# Patient Record
Sex: Female | Born: 1995 | Race: Black or African American | Hispanic: No | Marital: Single | Smoking: Never smoker
Health system: Southern US, Community
[De-identification: ages and names within clinical notes are randomized; demographics above are authoritative.]

## PROBLEM LIST (undated history)

## (undated) DIAGNOSIS — F319 Bipolar disorder, unspecified: Secondary | ICD-10-CM

## (undated) DIAGNOSIS — R51 Headache: Secondary | ICD-10-CM

## (undated) DIAGNOSIS — R519 Headache, unspecified: Secondary | ICD-10-CM

## (undated) HISTORY — DX: Headache, unspecified: R51.9

## (undated) HISTORY — DX: Headache: R51

## (undated) HISTORY — PX: FOOT SURGERY: SHX648

---

## 2016-01-10 ENCOUNTER — Emergency Department (HOSPITAL_COMMUNITY): Payer: Medicaid Other

## 2016-01-10 ENCOUNTER — Emergency Department (HOSPITAL_COMMUNITY)
Admission: EM | Admit: 2016-01-10 | Discharge: 2016-01-10 | Disposition: A | Discharge status: Home / Self Care | Payer: Medicaid Other | Attending: Emergency Medicine | Admitting: Emergency Medicine

## 2016-01-10 ENCOUNTER — Encounter (HOSPITAL_COMMUNITY): Payer: Self-pay

## 2016-01-10 DIAGNOSIS — R0982 Postnasal drip: Secondary | ICD-10-CM | POA: Diagnosis not present

## 2016-01-10 DIAGNOSIS — J3489 Other specified disorders of nose and nasal sinuses: Secondary | ICD-10-CM | POA: Insufficient documentation

## 2016-01-10 DIAGNOSIS — R6883 Chills (without fever): Secondary | ICD-10-CM | POA: Diagnosis not present

## 2016-01-10 DIAGNOSIS — F17200 Nicotine dependence, unspecified, uncomplicated: Secondary | ICD-10-CM | POA: Diagnosis not present

## 2016-01-10 DIAGNOSIS — R05 Cough: Secondary | ICD-10-CM | POA: Insufficient documentation

## 2016-01-10 DIAGNOSIS — R51 Headache: Secondary | ICD-10-CM | POA: Diagnosis not present

## 2016-01-10 DIAGNOSIS — J029 Acute pharyngitis, unspecified: Secondary | ICD-10-CM | POA: Diagnosis not present

## 2016-01-10 DIAGNOSIS — R0981 Nasal congestion: Secondary | ICD-10-CM | POA: Diagnosis not present

## 2016-01-10 DIAGNOSIS — R040 Epistaxis: Secondary | ICD-10-CM | POA: Diagnosis present

## 2016-01-10 MED ORDER — SALINE SPRAY 0.65 % NA SOLN: 1.0000 | NASAL | Status: DC | PRN

## 2016-01-10 MED ORDER — BENZONATATE 100 MG PO CAPS: 200.0000 mg | ORAL_CAPSULE | Freq: Two times a day (BID) | ORAL | Status: DC | PRN

## 2016-01-10 NOTE — Discharge Instructions (Signed)
Nosebleed °Nosebleeds are common. They are due to a crack in the inside lining of your nose (mucous membrane) or from a small blood vessel that starts to bleed. Nosebleeds can be caused by many conditions, such as injury, infections, dry mucous membranes or dry climate, medicines, nose picking, and home heating and cooling systems. Most nosebleeds come from blood vessels in the front of your nose. °HOME CARE INSTRUCTIONS  °· Try controlling your nosebleed by pinching your nostrils gently and continuously for at least 10 minutes. °· Avoid blowing or sniffing your nose for a number of hours after having a nosebleed. °· Do not put gauze inside your nose yourself. If your nose was packed by your health care provider, try to maintain the pack inside of your nose until your health care provider removes it. °· If a gauze pack was used and it starts to fall out, gently replace it or cut off the end of it. °· If a balloon catheter was used to pack your nose, do not cut or remove it unless your health care provider has instructed you to do that. °· Avoid lying down while you are having a nosebleed. Sit up and lean forward. °· Use a nasal spray decongestant to help with a nosebleed as directed by your health care provider. °· Do not use petroleum jelly or mineral oil in your nose. These can drip into your lungs. °· Maintain humidity in your home by using less air conditioning or by using a humidifier. °· Aspirin and blood thinners make bleeding more likely. If you are prescribed these medicines and you suffer from nosebleeds, ask your health care provider if you should stop taking the medicines or adjust the dose. Do not stop medicines unless directed by your health care provider °· Resume your normal activities as you are able, but avoid straining, lifting, or bending at the waist for several days. °· If your nosebleed was caused by dry mucous membranes, use over-the-counter saline nasal spray or gel. This will keep the  mucous membranes moist and allow them to heal. If you must use a lubricant, choose the water-soluble variety. Use it only sparingly, and do not use it within several hours of lying down. °· Keep all follow-up visits as directed by your health care provider. This is important. °SEEK MEDICAL CARE IF: °· You have a fever. °· You get frequent nosebleeds. °· You are getting nosebleeds more often. °SEEK IMMEDIATE MEDICAL CARE IF: °· Your nosebleed lasts longer than 20 minutes. °· Your nosebleed occurs after an injury to your face, and your nose looks crooked or broken. °· You have unusual bleeding from other parts of your body. °· You have unusual bruising on other parts of your body. °· You feel light-headed or you faint. °· You become sweaty. °· You vomit blood. °· Your nosebleed occurs after a head injury. °  °This information is not intended to replace advice given to you by your health care provider. Make sure you discuss any questions you have with your health care provider. °  °Document Released: 09/07/2005 Document Revised: 12/19/2014 Document Reviewed: 07/14/2014 °Elsevier Interactive Patient Education ©2016 [employer] ° °Acute Bronchitis °Bronchitis is inflammation of the airways that extend from the windpipe into the lungs (bronchi). The inflammation often causes mucus to develop. This leads to a cough, which is the most common symptom of bronchitis.  °In acute bronchitis, the condition usually develops suddenly and goes away over time, usually in a couple weeks. Smoking, allergies, and asthma   can make bronchitis worse. Repeated episodes of bronchitis may cause further lung problems.  °CAUSES °Acute bronchitis is most often caused by the same virus that causes a cold. The virus can spread from person to person (contagious) through coughing, sneezing, and touching contaminated objects. °SIGNS AND SYMPTOMS  °· Cough.   °· Fever.   °· Coughing up mucus.   °· Body aches.   °· Chest congestion.   °· Chills.    °· Shortness of breath.   °· Sore throat.   °DIAGNOSIS  °Acute bronchitis is usually diagnosed through a physical exam. Your health care provider will also ask you questions about your medical history. Tests, such as chest X-rays, are sometimes done to rule out other conditions.  °TREATMENT  °Acute bronchitis usually goes away in a couple weeks. Oftentimes, no medical treatment is necessary. Medicines are sometimes given for relief of fever or cough. Antibiotic medicines are usually not needed but may be prescribed in certain situations. In some cases, an inhaler may be recommended to help reduce shortness of breath and control the cough. A cool mist vaporizer may also be used to help thin bronchial secretions and make it easier to clear the chest.  °HOME CARE INSTRUCTIONS °· Get plenty of rest.   °· Drink enough fluids to keep your urine clear or pale yellow (unless you have a medical condition that requires fluid restriction). Increasing fluids may help thin your respiratory secretions (sputum) and reduce chest congestion, and it will prevent dehydration.   °· Take medicines only as directed by your health care provider. °· If you were prescribed an antibiotic medicine, finish it all even if you start to feel better. °· Avoid smoking and secondhand smoke. Exposure to cigarette smoke or irritating chemicals will make bronchitis worse. If you are a smoker, consider using nicotine gum or skin patches to help control withdrawal symptoms. Quitting smoking will help your lungs heal faster.   °· Reduce the chances of another bout of acute bronchitis by washing your hands frequently, avoiding people with cold symptoms, and trying not to touch your hands to your mouth, nose, or eyes.   °· Keep all follow-up visits as directed by your health care provider.   °SEEK MEDICAL CARE IF: °Your symptoms do not improve after 1 week of treatment.  °SEEK IMMEDIATE MEDICAL CARE IF: °· You develop an increased fever or chills.    °· You have chest pain.   °· You have severe shortness of breath. °· You have bloody sputum.   °· You develop dehydration. °· You faint or repeatedly feel like you are going to pass out. °· You develop repeated vomiting. °· You develop a severe headache. °MAKE SURE YOU:  °· Understand these instructions. °· Will watch your condition. °· Will get help right away if you are not doing well or get worse. °  °This information is not intended to replace advice given to you by your health care provider. Make sure you discuss any questions you have with your health care provider. °  °Document Released: 01/05/2005 Document Revised: 12/19/2014 Document Reviewed: 05/21/2013 °Elsevier Interactive Patient Education ©2016 [employer] ° °

## 2016-01-10 NOTE — ED Provider Notes (Signed)
CSN: [id number]     Arrival date & time 01/10/16  1514 History   First MD Initiated Contact with Patient 01/10/16 1550     Chief Complaint  Patient presents with  . Epistaxis  . Generalized Body Aches  . Emesis     (Consider location/radiation/quality/duration/timing/severity/associated sxs/prior Treatment) HPI Comments: Patient presents to the ED with a chief complaint of nasal congestion with some blood when she blows her nose.  She states that the symptoms have been ongoing for the past two days.  She has some associated generalized body aches and headache.  She also complains of ear congestion and some chest pain when coughing.  She denies productive cough.  Denies fevers or chills.  Denies history of ACS, PE, DVT, recent long travel or surgery.  The history is provided by the patient. No language interpreter was used.    History reviewed. No pertinent past medical history. History reviewed. No pertinent past surgical history. History reviewed. No pertinent family history. Social History  Substance Use Topics  . Smoking status: Current Some Day Smoker  . Smokeless tobacco: None  . Alcohol Use: Yes   OB History    No data available     Review of Systems  Constitutional: Positive for chills. Negative for fever.  HENT: Positive for postnasal drip, rhinorrhea, sinus pressure, sneezing and sore throat.   Respiratory: Positive for cough. Negative for shortness of breath.   Cardiovascular: Negative for chest pain.  Gastrointestinal: Negative for nausea, vomiting, abdominal pain, diarrhea and constipation.  Genitourinary: Negative for dysuria.  All other systems reviewed and are negative.     Allergies  Review of patient's allergies indicates not on file.  Home Medications   Prior to Admission medications   Not on File   BP 136/82 mmHg  Pulse 88  Temp(Src) 98.6 F (37 C) (Oral)  Resp 20  SpO2 100% Physical Exam Physical Exam  Constitutional: Pt  is oriented to  person, place, and time. Appears well-developed and well-nourished. No distress.  HENT:  Head: Normocephalic and atraumatic.  Right Ear: Tympanic membrane, external ear and ear canal normal.  Left Ear: Tympanic membrane, external ear and ear canal normal.  Nose: Mucosal edema and moderate rhinorrhea present. No epistaxis. Right sinus exhibits no maxillary sinus tenderness and no frontal sinus tenderness. Left sinus exhibits no maxillary sinus tenderness and no frontal sinus tenderness.  Mouth/Throat: Uvula is midline and mucous membranes are normal. Mucous membranes are not pale and not cyanotic. No oropharyngeal exudate, posterior oropharyngeal edema, posterior oropharyngeal erythema or tonsillar abscesses.  Eyes: Conjunctivae are normal. Pupils are equal, round, and reactive to light.  Neck: Normal range of motion and full passive range of motion without pain.  Cardiovascular: Normal rate and intact distal pulses.   Pulmonary/Chest: Effort normal and breath sounds normal. No stridor.  Clear and equal breath sounds without focal wheezes, rhonchi, rales  Abdominal: Soft. Bowel sounds are normal. There is no tenderness.  Musculoskeletal: Normal range of motion.  Lymphadenopathy:    Pthas no cervical adenopathy.  Neurological: Pt is alert and oriented to person, place, and time.  Skin: Skin is warm and dry. No rash noted. Pt is not diaphoretic.  Psychiatric: Normal mood and affect.  Nursing note and vitals reviewed.   ED Course  Procedures (including critical care time)  Imaging Review Dg Chest 2 View  01/10/2016  CLINICAL DATA:  Central chest pain and shortness of breath. Coughing. EXAM: CHEST  2 VIEW COMPARISON:  None. FINDINGS: Heart  size is normal. Mediastinal shadows are normal. There is central bronchial thickening but no infiltrate, collapse or effusion. No bony abnormality. IMPRESSION: Possible bronchitis.  No consolidation or collapse. Electronically Signed   By: Paulina Fusi M.D.    On: 01/10/2016 16:15   I have personally reviewed and evaluated these images and lab results as part of my medical decision-making.    MDM   Final diagnoses:  Sinus congestion  Epistaxis    Patient with sinus congestion, some blood after blowing nose.  Will recommend nasal saline.  Afebrile, VSS.  No history of ACS, DVT, or PE.  Suspect that chest pain is from coughing.  CXR is remarkable for possible bronchitis.  Patient is very well appearing.  Recommend PCP follow-up.   Roxy Horseman, PA-C 01/10/16 1625  Marily Memos, MD 01/10/16 [phone]

## 2016-01-10 NOTE — ED Notes (Signed)
Patient was alert, oriented and stable upon discharge. RN went over AVS and patient had no further questions.  

## 2016-01-10 NOTE — ED Notes (Addendum)
Pt c/o epistaxis, generalized body aches, headache, and n/v starting this morning and constipation x 2 days.  Pain score 6/10.  Pt reports "my nose is bleeding, but it's staying up, inside.  I see it when I blow my nose.  It's blood mixed w/ mucus."  No bleeding noted.  Pt reports chronic GI issues.

## 2016-01-18 ENCOUNTER — Emergency Department (INDEPENDENT_AMBULATORY_CARE_PROVIDER_SITE_OTHER)
Admission: EM | Admit: 2016-01-18 | Discharge: 2016-01-18 | Disposition: A | Discharge status: Home / Self Care | Payer: Medicaid Other | Source: Home / Self Care | Attending: Family Medicine | Admitting: Family Medicine

## 2016-01-18 ENCOUNTER — Other Ambulatory Visit (HOSPITAL_COMMUNITY)
Admission: RE | Admit: 2016-01-18 | Discharge: 2016-01-18 | Disposition: A | Discharge status: Home / Self Care | Payer: Medicaid Other | Source: Ambulatory Visit | Attending: Family Medicine | Admitting: Family Medicine

## 2016-01-18 ENCOUNTER — Encounter (HOSPITAL_COMMUNITY): Payer: Self-pay | Admitting: Emergency Medicine

## 2016-01-18 DIAGNOSIS — J40 Bronchitis, not specified as acute or chronic: Secondary | ICD-10-CM | POA: Diagnosis present

## 2016-01-18 LAB — POCT RAPID STREP A: Streptococcus, Group A Screen (Direct): NEGATIVE

## 2016-01-18 MED ORDER — ALBUTEROL SULFATE HFA 108 (90 BASE) MCG/ACT IN AERS: 2.0000 | INHALATION_SPRAY | RESPIRATORY_TRACT | Status: DC | PRN

## 2016-01-18 MED ORDER — AZITHROMYCIN 250 MG PO TABS: ORAL_TABLET | ORAL | Status: DC

## 2016-01-18 NOTE — ED Notes (Signed)
The patient presented to the [facility] with a complaint of a sore throat and dizziness  x 1 week. The patient stated that she was evaluated at [facility] ED on 01/10/2016 and was diagnosed with an ear infection and bronchitis. The patient was prescribed amoxacillin and is currently still taking it.

## 2016-01-18 NOTE — Discharge Instructions (Signed)
Upper Respiratory Infection, Adult °Most upper respiratory infections (URIs) are a viral infection of the air passages leading to the lungs. A URI affects the nose, throat, and upper air passages. The most common type of URI is nasopharyngitis and is typically referred to as "the common cold." °URIs run their course and usually go away on their own. Most of the time, a URI does not require medical attention, but sometimes a bacterial infection in the upper airways can follow a viral infection. This is called a secondary infection. Sinus and middle ear infections are common types of secondary upper respiratory infections. °Bacterial pneumonia can also complicate a URI. A URI can worsen asthma and chronic obstructive pulmonary disease (COPD). Sometimes, these complications can require emergency medical care and may be life threatening.  °CAUSES °Almost all URIs are caused by viruses. A virus is a type of germ and can spread from one person to another.  °RISKS FACTORS °You may be at risk for a URI if:  °· You smoke.   °· You have chronic heart or lung disease. °· You have a weakened defense (immune) system.   °· You are very young or very old.   °· You have nasal allergies or asthma. °· You work in crowded or poorly ventilated areas. °· You work in health care facilities or schools. °SIGNS AND SYMPTOMS  °Symptoms typically develop 2-3 days after you come in contact with a cold virus. Most viral URIs last 7-10 days. However, viral URIs from the influenza virus (flu virus) can last 14-18 days and are typically more severe. Symptoms may include:  °· Runny or stuffy (congested) nose.   °· Sneezing.   °· Cough.   °· Sore throat.   °· Headache.   °· Fatigue.   °· Fever.   °· Loss of appetite.   °· Pain in your forehead, behind your eyes, and over your cheekbones (sinus pain). °· Muscle aches.   °DIAGNOSIS  °Your health care provider may diagnose a URI by: °· Physical exam. °· Tests to check that your symptoms are not due to  another condition such as: °· Strep throat. °· Sinusitis. °· Pneumonia. °· Asthma. °TREATMENT  °A URI goes away on its own with time. It cannot be cured with medicines, but medicines may be prescribed or recommended to relieve symptoms. Medicines may help: °· Reduce your fever. °· Reduce your cough. °· Relieve nasal congestion. °HOME CARE INSTRUCTIONS  °· Take medicines only as directed by your health care provider.   °· Gargle warm saltwater or take cough drops to comfort your throat as directed by your health care provider. °· Use a warm mist humidifier or inhale steam from a shower to increase air moisture. This may make it easier to breathe. °· Drink enough fluid to keep your urine clear or pale yellow.   °· Eat soups and other clear broths and maintain good nutrition.   °· Rest as needed.   °· Return to work when your temperature has returned to normal or as your health care provider advises. You may need to stay home longer to avoid infecting others. You can also use a face mask and careful hand washing to prevent spread of the virus. °· Increase the usage of your inhaler if you have asthma.   °· Do not use any tobacco products, including cigarettes, chewing tobacco, or electronic cigarettes. If you need help quitting, ask your health care provider. °PREVENTION  °The best way to protect yourself from getting a cold is to practice good hygiene.  °· Avoid oral or hand contact with people with cold   symptoms.   °· Wash your hands often if contact occurs.   °There is no clear evidence that vitamin C, vitamin E, echinacea, or exercise reduces the chance of developing a cold. However, it is always recommended to get plenty of rest, exercise, and practice good nutrition.  °SEEK MEDICAL CARE IF:  °· You are getting worse rather than better.   °· Your symptoms are not controlled by medicine.   °· You have chills. °· You have worsening shortness of breath. °· You have brown or red mucus. °· You have yellow or brown nasal  discharge. °· You have pain in your face, especially when you bend forward. °· You have a fever. °· You have swollen neck glands. °· You have pain while swallowing. °· You have white areas in the back of your throat. °SEEK IMMEDIATE MEDICAL CARE IF:  °· You have severe or persistent: °¨ Headache. °¨ Ear pain. °¨ Sinus pain. °¨ Chest pain. °· You have chronic lung disease and any of the following: °¨ Wheezing. °¨ Prolonged cough. °¨ Coughing up blood. °¨ A change in your usual mucus. °· You have a stiff neck. °· You have changes in your: °¨ Vision. °¨ Hearing. °¨ Thinking. °¨ Mood. °MAKE SURE YOU:  °· Understand these instructions. °· Will watch your condition. °· Will get help right away if you are not doing well or get worse. °  °This information is not intended to replace advice given to you by your health care provider. Make sure you discuss any questions you have with your health care provider. °  °Document Released: 05/24/2001 Document Revised: 04/14/2015 Document Reviewed: 03/05/2014 °Elsevier Interactive Patient Education ©2016 [employer] °Acute Bronchitis °Bronchitis is inflammation of the airways that extend from the windpipe into the lungs (bronchi). The inflammation often causes mucus to develop. This leads to a cough, which is the most common symptom of bronchitis.  °In acute bronchitis, the condition usually develops suddenly and goes away over time, usually in a couple weeks. Smoking, allergies, and asthma can make bronchitis worse. Repeated episodes of bronchitis may cause further lung problems.  °CAUSES °Acute bronchitis is most often caused by the same virus that causes a cold. The virus can spread from person to person (contagious) through coughing, sneezing, and touching contaminated objects. °SIGNS AND SYMPTOMS  °· Cough.   °· Fever.   °· Coughing up mucus.   °· Body aches.   °· Chest congestion.   °· Chills.   °· Shortness of breath.   °· Sore throat.   °DIAGNOSIS  °Acute bronchitis is  usually diagnosed through a physical exam. Your health care provider will also ask you questions about your medical history. Tests, such as chest X-rays, are sometimes done to rule out other conditions.  °TREATMENT  °Acute bronchitis usually goes away in a couple weeks. Oftentimes, no medical treatment is necessary. Medicines are sometimes given for relief of fever or cough. Antibiotic medicines are usually not needed but may be prescribed in certain situations. In some cases, an inhaler may be recommended to help reduce shortness of breath and control the cough. A cool mist vaporizer may also be used to help thin bronchial secretions and make it easier to clear the chest.  °HOME CARE INSTRUCTIONS °· Get plenty of rest.   °· Drink enough fluids to keep your urine clear or pale yellow (unless you have a medical condition that requires fluid restriction). Increasing fluids may help thin your respiratory secretions (sputum) and reduce chest congestion, and it will prevent dehydration.   °· Take medicines only as directed by   your health care provider. °· If you were prescribed an antibiotic medicine, finish it all even if you start to feel better. °· Avoid smoking and secondhand smoke. Exposure to cigarette smoke or irritating chemicals will make bronchitis worse. If you are a smoker, consider using nicotine gum or skin patches to help control withdrawal symptoms. Quitting smoking will help your lungs heal faster.   °· Reduce the chances of another bout of acute bronchitis by washing your hands frequently, avoiding people with cold symptoms, and trying not to touch your hands to your mouth, nose, or eyes.   °· Keep all follow-up visits as directed by your health care provider.   °SEEK MEDICAL CARE IF: °Your symptoms do not improve after 1 week of treatment.  °SEEK IMMEDIATE MEDICAL CARE IF: °· You develop an increased fever or chills.   °· You have chest pain.   °· You have severe shortness of breath. °· You have bloody  sputum.   °· You develop dehydration. °· You faint or repeatedly feel like you are going to pass out. °· You develop repeated vomiting. °· You develop a severe headache. °MAKE SURE YOU:  °· Understand these instructions. °· Will watch your condition. °· Will get help right away if you are not doing well or get worse. °  °This information is not intended to replace advice given to you by your health care provider. Make sure you discuss any questions you have with your health care provider. °  °Document Released: 01/05/2005 Document Revised: 12/19/2014 Document Reviewed: 05/21/2013 °Elsevier Interactive Patient Education ©2016 [employer] ° °

## 2016-01-18 NOTE — ED Provider Notes (Signed)
CSN: [id number]     Arrival date & time 01/18/16  1604 History   First MD Initiated Contact with Patient 01/18/16 1743     Chief Complaint  Patient presents with  . Sore Throat  . Dizziness   (Consider location/radiation/quality/duration/timing/severity/associated sxs/prior Treatment) HPI History obtained from patient:   LOCATION: Upper respiratory SEVERITY: 3 DURATION: Over 2 weeks  CONTEXT: Sudden onset Better for a while and then came back and is worse at this time. QUALITY: MODIFYING FACTORS: Over-the-counter medicines without relief of symptoms ASSOCIATED SYMPTOMS: Cough dry hacking unable to sleep TIMING: Constant OCCUPATION:  History reviewed. No pertinent past medical history. History reviewed. No pertinent past surgical history. History reviewed. No pertinent family history. Social History  Substance Use Topics  . Smoking status: Current Some Day Smoker  . Smokeless tobacco: None  . Alcohol Use: Yes   OB History    No data available     Review of Systems ROS +'ve cough Denies: HEADACHE, NAUSEA, ABDOMINAL PAIN, CHEST PAIN, CONGESTION, DYSURIA, SHORTNESS OF BREATH  Allergies  Review of patient's allergies indicates no known allergies.  Home Medications   Prior to Admission medications   Medication Sig Start Date End Date Taking? Authorizing Provider  benzonatate (TESSALON) 100 MG capsule Take 2 capsules (200 mg total) by mouth 2 (two) times daily as needed for cough. 01/10/16  Yes Roxy Horseman, PA-C  levocetirizine (XYZAL) 5 MG tablet Take 5 mg by mouth 2 (two) times daily.   Yes Historical Provider, MD  ranitidine (ZANTAC) 300 MG tablet Take 300 mg by mouth 2 (two) times daily.   Yes Historical Provider, MD  sodium chloride (OCEAN) 0.65 % SOLN nasal spray Place 1 spray into both nostrils as needed for congestion. 01/10/16  Yes Roxy Horseman, PA-C  albuterol (PROVENTIL HFA;VENTOLIN HFA) 108 (90 Base) MCG/ACT inhaler Inhale 2 puffs into the lungs every 4  (four) hours as needed for wheezing or shortness of breath. 01/18/16   Tharon Aquas, PA  azithromycin (ZITHROMAX) 250 MG tablet Take first 2 tablets together, then 1 every day until finished. 01/18/16   Tharon Aquas, PA   Meds Ordered and Administered this Visit  Medications - No data to display  BP 137/80 mmHg  Pulse 79  Temp(Src) 98.9 F (37.2 C) (Oral)  Resp 16  SpO2 100% No data found.   Physical Exam  Constitutional: She is oriented to person, place, and time. She appears well-developed and well-nourished.  HENT:  Head: Normocephalic and atraumatic.  Right Ear: External ear normal.  Left Ear: External ear normal.  Eyes: Conjunctivae are normal.  Neck: Normal range of motion. Neck supple.  Pulmonary/Chest: Effort normal and breath sounds normal.  Coarse breath sounds  Musculoskeletal: Normal range of motion.  Neurological: She is alert and oriented to person, place, and time.  Skin: Skin is warm and dry.  Psychiatric: She has a normal mood and affect. Her behavior is normal.  Vitals reviewed.   ED Course  Procedures (including critical care time)  Labs Review Labs Reviewed  POCT RAPID STREP A    Imaging Review No results found.   Visual Acuity Review  Right Eye Distance:   Left Eye Distance:   Bilateral Distance:    Right Eye Near:   Left Eye Near:    Bilateral Near:         MDM   1. Bronchitis     Patient is advised to continue home symptomatic treatment. Prescription for zpak and albuterol sent pharmacy  patient has indicated. Patient is advised that if there are new or worsening symptoms or attend the emergency department, or contact primary care provider. Instructions of care provided discharged home in stable condition. Return to work/school note provided.  THIS NOTE WAS GENERATED USING A VOICE RECOGNITION SOFTWARE PROGRAM. ALL REASONABLE EFFORTS  WERE MADE TO PROOFREAD THIS DOCUMENT FOR ACCURACY.     Tharon Aquas, PA 01/18/16  2052

## 2016-01-21 LAB — CULTURE, GROUP A STREP (THRC)

## 2016-02-04 ENCOUNTER — Encounter (HOSPITAL_COMMUNITY): Payer: Self-pay | Admitting: Family Medicine

## 2016-02-04 ENCOUNTER — Emergency Department (INDEPENDENT_AMBULATORY_CARE_PROVIDER_SITE_OTHER)
Admission: EM | Admit: 2016-02-04 | Discharge: 2016-02-04 | Disposition: A | Discharge status: Home / Self Care | Payer: Medicaid Other | Source: Home / Self Care | Attending: Family Medicine | Admitting: Family Medicine

## 2016-02-04 DIAGNOSIS — J019 Acute sinusitis, unspecified: Secondary | ICD-10-CM | POA: Diagnosis not present

## 2016-02-04 DIAGNOSIS — Z9119 Patient's noncompliance with other medical treatment and regimen: Secondary | ICD-10-CM | POA: Diagnosis not present

## 2016-02-04 DIAGNOSIS — Z91199 Patient's noncompliance with other medical treatment and regimen due to unspecified reason: Secondary | ICD-10-CM

## 2016-02-04 MED ORDER — FLUCONAZOLE 150 MG PO TABS: 150.0000 mg | ORAL_TABLET | Freq: Every day | ORAL | Status: DC

## 2016-02-04 MED ORDER — AMOXICILLIN-POT CLAVULANATE 875-125 MG PO TABS: 1.0000 | ORAL_TABLET | Freq: Two times a day (BID) | ORAL | Status: DC

## 2016-02-04 NOTE — ED Notes (Signed)
Cough for one week.

## 2016-02-04 NOTE — Discharge Instructions (Signed)
He developed maxillary sinusitis. This is a deep infection of the face gets significantly worse if not properly treated. Please take antibiotics as prescribed. If you develop a vaginal yeast infection from the medicine please start the Diflucan. Please use ibuprofen 600 mg every 6 hours for pain and fever. Her cough will likely last an additional 6 weeks due to the prolonged irritation of the lungs. Please take all of her medications exactly as prescribed. Please go to the emergency room if you get worse.

## 2016-02-04 NOTE — ED Provider Notes (Addendum)
CSN: [id number]     Arrival date & time 02/04/16  1608 History   First MD Initiated Contact with Patient 02/04/16 1705     No chief complaint on file.  (Consider location/radiation/quality/duration/timing/severity/associated sxs/prior Treatment) HPI   Patient seen in urgent care clinic on 01/18/2016 and started on amoxicillin for ear infection. Patient states that she only took the medicine once a day and intermittently stops the medicine during her treatment. She stated that she did this because she felt intermittently better. States that she has significantly worse partially 4 days ago involving worsening facial pain, cough, sore throat. Multiple sick contacts. Associated with headache, subjective fevers, runny nose, cough. Denies any nausea vomiting diarrhea constipation abdominal pain dysuria frequency back pain rash.  History reviewed. No pertinent past medical history. History reviewed. No pertinent past surgical history. Family History  Problem Relation Age of Onset  . Hypertension Other   . Diabetes Other   . Stroke Other   . Heart failure Other    Social History  Substance Use Topics  . Smoking status: Current Some Day Smoker  . Smokeless tobacco: None  . Alcohol Use: Yes   OB History    No data available     Review of Systems Per HPI with all other pertinent systems negative.   Allergies  Review of patient's allergies indicates no known allergies.  Home Medications   Prior to Admission medications   Medication Sig Start Date End Date Taking? Authorizing Provider  albuterol (PROVENTIL HFA;VENTOLIN HFA) 108 (90 Base) MCG/ACT inhaler Inhale 2 puffs into the lungs every 4 (four) hours as needed for wheezing or shortness of breath. 01/18/16   Tharon Aquas, PA  amoxicillin-clavulanate (AUGMENTIN) 875-125 MG tablet Take 1 tablet by mouth 2 (two) times daily. 02/04/16   Ozella Rocks, MD  fluconazole (DIFLUCAN) 150 MG tablet Take 1 tablet (150 mg total) by mouth daily.  Repeat dose in 3 days 02/04/16   Ozella Rocks, MD  levocetirizine (XYZAL) 5 MG tablet Take 5 mg by mouth 2 (two) times daily.    Historical Provider, MD  ranitidine (ZANTAC) 300 MG tablet Take 300 mg by mouth 2 (two) times daily.    Historical Provider, MD   Meds Ordered and Administered this Visit  Medications - No data to display  BP 129/83 mmHg  Pulse 66  Temp(Src) 98.8 F (37.1 C) (Oral)  Resp 16  SpO2 100% No data found.   Physical Exam Physical Exam  Constitutional: oriented to person, place, and time. appears well-developed and well-nourished. No distress.  HENT: TMs normal bilaterally Right maxillary sinus very tender to palpation. Pharyngeal cobblestoning. Head: Normocephalic and atraumatic.  Eyes: EOMI. PERRL.  Neck: Normal range of motion.  Cardiovascular: RRR, no m/r/g, 2+ distal pulses,  Pulmonary/Chest: Effort normal and breath sounds normal. No respiratory distress.  Abdominal: Soft. Bowel sounds are normal. NonTTP, no distension.  Musculoskeletal: Normal range of motion. Non ttp, no effusion.  Neurological: alert and oriented to person, place, and time.  Skin: Skin is warm. No rash noted. non diaphoretic.  Psychiatric: normal mood and affect. behavior is normal. Judgment and thought content normal.   ED Course  Procedures (including critical care time)  Labs Review Labs Reviewed - No data to display  Imaging Review No results found.   Visual Acuity Review  Right Eye Distance:   Left Eye Distance:   Bilateral Distance:    Right Eye Near:   Left Eye Near:    Bilateral [id number]:  MDM   1. Subacute sinusitis   2. History of noncompliance with medical treatment    .Augmentin. Diflucan if develops yeast infection. Lengthy discussion had with patient regarding proper use of antibiotics, bacterial resistance, and importance of medical compliance with recommended regimens  I have verbally reviewed the discharge instructions with the patient.  A printed AVS was given to the patient.  All questions were answered prior to discharge.    Ozella Rocks, MD 02/04/16 1731  Ozella Rocks, MD 02/04/16 [phone]

## 2016-02-06 ENCOUNTER — Encounter (HOSPITAL_COMMUNITY): Payer: Self-pay | Admitting: Emergency Medicine

## 2016-02-06 ENCOUNTER — Emergency Department (HOSPITAL_COMMUNITY)
Admission: EM | Admit: 2016-02-06 | Discharge: 2016-02-06 | Disposition: A | Discharge status: Home / Self Care | Payer: Medicaid Other | Attending: Emergency Medicine | Admitting: Emergency Medicine

## 2016-02-06 DIAGNOSIS — R42 Dizziness and giddiness: Secondary | ICD-10-CM | POA: Diagnosis not present

## 2016-02-06 DIAGNOSIS — Z792 Long term (current) use of antibiotics: Secondary | ICD-10-CM | POA: Diagnosis not present

## 2016-02-06 DIAGNOSIS — J069 Acute upper respiratory infection, unspecified: Secondary | ICD-10-CM | POA: Insufficient documentation

## 2016-02-06 DIAGNOSIS — Z79899 Other long term (current) drug therapy: Secondary | ICD-10-CM | POA: Insufficient documentation

## 2016-02-06 DIAGNOSIS — Z8669 Personal history of other diseases of the nervous system and sense organs: Secondary | ICD-10-CM | POA: Insufficient documentation

## 2016-02-06 DIAGNOSIS — J029 Acute pharyngitis, unspecified: Secondary | ICD-10-CM | POA: Diagnosis present

## 2016-02-06 MED ORDER — ACETAMINOPHEN 325 MG PO TABS: 650.0000 mg | ORAL_TABLET | Freq: Once | ORAL | Status: DC

## 2016-02-06 MED ORDER — LIDOCAINE VISCOUS 2 % MT SOLN: 15.0000 mL | Freq: Once | OROMUCOSAL | Status: DC

## 2016-02-06 MED ORDER — IBUPROFEN 400 MG PO TABS: 800.0000 mg | ORAL_TABLET | Freq: Once | ORAL | Status: DC

## 2016-02-06 NOTE — ED Notes (Signed)
PT opened door to her room to report that she does not want any x-ray or any other treatment . Pt reports the plan to go to her PCP and get checked out.

## 2016-02-06 NOTE — ED Notes (Signed)
Pt reports that she has flu symptoms. Pt c/o cough, sore throat, nausea and that she recently visited her mom who was diagnosed with flu. Pt seen at urgent care recently and was told she had sinus infection. Pt reports that she has history of sinus infections and these symptoms are not like that.

## 2016-02-06 NOTE — ED Provider Notes (Signed)
CSN: [id number]     Arrival date & time 02/06/16  0821 History  By signing my name below, I, Erica Tran, attest that this documentation has been prepared under the direction and in the presence of [name], PA-C Electronically Signed: Soijett Tran, ED Scribe. 02/06/2016. 9:08 AM.   Chief Complaint  Patient presents with  . Influenza  . Cough  . Sore Throat  . Nausea     The history is provided by the patient. No language interpreter was used.    Erica Tran is a 20 y.o. female who presents to the Emergency Department complaining of gradually worsening, constant, moderate dry cough onset 1 week ago. Associated symptoms of nasal congestion, subjective fever, fatigue, dizziness, generalized body aches, and sore throat. She states that she has tried Rx abx for the relief for her symptoms. She denies vomiting and any other symptoms. She notes that she feels like she has flu-like symptoms and that she just got over bronchitis and an ear infection. Pt was seen recently at an Urgent Care and dx with a sinus infection and treated with abx that she just started yesterday. Pt denies getting a flu shot this past year. Pt notes that her mother was recently dx with flu.   History reviewed. No pertinent past medical history. History reviewed. No pertinent past surgical history. Family History  Problem Relation Age of Onset  . Hypertension Other   . Diabetes Other   . Stroke Other   . Heart failure Other    Social History  Substance Use Topics  . Smoking status: Never Smoker   . Smokeless tobacco: None  . Alcohol Use: Yes   OB History    No data available     Review of Systems  Constitutional: Positive for fever (subjective) and fatigue.  HENT: Positive for congestion and sore throat.   Respiratory: Positive for cough.   Gastrointestinal: Negative for vomiting.  Musculoskeletal: Positive for myalgias.  All other systems reviewed and are negative.     Allergies  Review of  patient's allergies indicates no known allergies.  Home Medications   Prior to Admission medications   Medication Sig Start Date End Date Taking? Authorizing Provider  albuterol (PROVENTIL HFA;VENTOLIN HFA) 108 (90 Base) MCG/ACT inhaler Inhale 2 puffs into the lungs every 4 (four) hours as needed for wheezing or shortness of breath. 01/18/16   Tharon Aquas, PA  amoxicillin-clavulanate (AUGMENTIN) 875-125 MG tablet Take 1 tablet by mouth 2 (two) times daily. 02/04/16   Ozella Rocks, MD  fluconazole (DIFLUCAN) 150 MG tablet Take 1 tablet (150 mg total) by mouth daily. Repeat dose in 3 days 02/04/16   Ozella Rocks, MD  levocetirizine (XYZAL) 5 MG tablet Take 5 mg by mouth 2 (two) times daily.    Historical Provider, MD  ranitidine (ZANTAC) 300 MG tablet Take 300 mg by mouth 2 (two) times daily.    Historical Provider, MD   BP 127/79 mmHg  Pulse 90  Temp(Src) 98.5 F (36.9 C) (Oral)  Resp 20  Wt 110.819 kg  SpO2 98% Physical Exam  Constitutional: She is oriented to person, place, and time. She appears well-developed and well-nourished.  Non-toxic appearance. She does not have a sickly appearance. She does not appear ill.  HENT:  Head: Normocephalic and atraumatic.  Mouth/Throat: Oropharynx is clear and moist.  Eyes: Conjunctivae are normal. Pupils are equal, round, and reactive to light.  Neck: Normal range of motion. Neck supple.  Cardiovascular: Normal rate, regular  rhythm and normal heart sounds.   No murmur heard. Pulmonary/Chest: Effort normal and breath sounds normal. No accessory muscle usage or stridor. No respiratory distress. She has no wheezes. She has no rhonchi. She has no rales.  Abdominal: Soft. Bowel sounds are normal. She exhibits no distension. There is no tenderness.  Musculoskeletal: Normal range of motion.  Lymphadenopathy:    She has no cervical adenopathy.  Neurological: She is alert and oriented to person, place, and time.  Speech clear without dysarthria.   Skin: Skin is warm and dry.  Psychiatric: She has a normal mood and affect. Her behavior is normal.  Nursing note and vitals reviewed.   ED Course  Procedures (including critical care time) DIAGNOSTIC STUDIES: Oxygen Saturation is 98% on RA, nl by my interpretation.    COORDINATION OF CARE: 9:08 AM Discussed treatment plan with pt at bedside which includes CXR, vicous lidocaine, and pt agreed to plan.    Labs Review Labs Reviewed - No data to display  Imaging Review No results found. I have personally reviewed and evaluated these images as part of my medical decision-making.   EKG Interpretation None      MDM   Final diagnoses:  URI (upper respiratory infection)    Pt symptoms consistent with URI. CENTOR criteria 0.  Patient given ibuprofen, tylenol, and viscous lidocaine in ED. Extensive discussion of treatment of the flu versus other viral infections, and that flu testing is not indicated in this setting.  No comorbidities. Pt will be discharged with symptomatic treatment.  Patient informed the nurse that she did not want to stay for CXR or wait of medication to be administered.  Patient informed me that she will go to her PCP for the flu test.  Discussed return precautions. Pt is hemodynamically stable & in NAD prior to discharge.    I personally performed the services described in this documentation, which was scribed in my presence. The recorded information has been reviewed and is accurate.    [name], PA-C 02/06/16 1191  Alvira Monday, MD 02/07/16 [phone]

## 2016-02-06 NOTE — ED Notes (Signed)
PT refuses all treatment

## 2016-02-06 NOTE — Discharge Instructions (Signed)
Upper Respiratory Infection, Adult Most upper respiratory infections (URIs) are a viral infection of the air passages leading to the lungs. A URI affects the nose, throat, and upper air passages. The most common type of URI is nasopharyngitis and is typically referred to as "the common cold." URIs run their course and usually go away on their own. Most of the time, a URI does not require medical attention, but sometimes a bacterial infection in the upper airways can follow a viral infection. This is called a secondary infection. Sinus and middle ear infections are common types of secondary upper respiratory infections. Bacterial pneumonia can also complicate a URI. A URI can worsen asthma and chronic obstructive pulmonary disease (COPD). Sometimes, these complications can require emergency medical care and may be life threatening.  CAUSES Almost all URIs are caused by viruses. A virus is a type of germ and can spread from one person to another.  RISKS FACTORS You may be at risk for a URI if:   You smoke.   You have chronic heart or lung disease.  You have a weakened defense (immune) system.   You are very young or very old.   You have nasal allergies or asthma.  You work in crowded or poorly ventilated areas.  You work in health care facilities or schools. SIGNS AND SYMPTOMS  Symptoms typically develop 2-3 days after you come in contact with a cold virus. Most viral URIs last 7-10 days. However, viral URIs from the influenza virus (flu virus) can last 14-18 days and are typically more severe. Symptoms may include:   Runny or stuffy (congested) nose.   Sneezing.   Cough.   Sore throat.   Headache.   Fatigue.   Fever.   Loss of appetite.   Pain in your forehead, behind your eyes, and over your cheekbones (sinus pain).  Muscle aches.  DIAGNOSIS  Your health care provider may diagnose a URI by:  Physical exam.  Tests to check that your symptoms are not due to  another condition such as:  Strep throat.  Sinusitis.  Pneumonia.  Asthma. TREATMENT  A URI goes away on its own with time. It cannot be cured with medicines, but medicines may be prescribed or recommended to relieve symptoms. Medicines may help:  Reduce your fever.  Reduce your cough.  Relieve nasal congestion. HOME CARE INSTRUCTIONS   Take medicines only as directed by your health care provider.   Gargle warm saltwater or take cough drops to comfort your throat as directed by your health care provider.  Use a warm mist humidifier or inhale steam from a shower to increase air moisture. This may make it easier to breathe.  Drink enough fluid to keep your urine clear or pale yellow.   Eat soups and other clear broths and maintain good nutrition.   Rest as needed.   Return to work when your temperature has returned to normal or as your health care provider advises. You may need to stay home longer to avoid infecting others. You can also use a face mask and careful hand washing to prevent spread of the virus.  Increase the usage of your inhaler if you have asthma.   Do not use any tobacco products, including cigarettes, chewing tobacco, or electronic cigarettes. If you need help quitting, ask your health care provider. PREVENTION  The best way to protect yourself from getting a cold is to practice good hygiene.   Avoid oral or hand contact with people with cold   symptoms.   Wash your hands often if contact occurs.  There is no clear evidence that vitamin C, vitamin E, echinacea, or exercise reduces the chance of developing a cold. However, it is always recommended to get plenty of rest, exercise, and practice good nutrition.  SEEK MEDICAL CARE IF:   You are getting worse rather than better.   Your symptoms are not controlled by medicine.   You have chills.  You have worsening shortness of breath.  You have brown or red mucus.  You have yellow or brown nasal  discharge.  You have pain in your face, especially when you bend forward.  You have a fever.  You have swollen neck glands.  You have pain while swallowing.  You have white areas in the back of your throat. SEEK IMMEDIATE MEDICAL CARE IF:   You have severe or persistent:  Headache.  Ear pain.  Sinus pain.  Chest pain.  You have chronic lung disease and any of the following:  Wheezing.  Prolonged cough.  Coughing up blood.  A change in your usual mucus.  You have a stiff neck.  You have changes in your:  Vision.  Hearing.  Thinking.  Mood. MAKE SURE YOU:   Understand these instructions.  Will watch your condition.  Will get help right away if you are not doing well or get worse.   This information is not intended to replace advice given to you by your health care provider. Make sure you discuss any questions you have with your health care provider.   Document Released: 05/24/2001 Document Revised: 04/14/2015 Document Reviewed: 03/05/2014 Elsevier Interactive Patient Education 2016 [employer]  

## 2016-02-06 NOTE — ED Notes (Signed)
Declined W/C at D/C and was escorted to lobby by RN. 

## 2016-04-07 ENCOUNTER — Encounter (HOSPITAL_COMMUNITY): Payer: Self-pay | Admitting: Emergency Medicine

## 2016-04-07 ENCOUNTER — Emergency Department (HOSPITAL_COMMUNITY)
Admission: EM | Admit: 2016-04-07 | Discharge: 2016-04-07 | Disposition: A | Discharge status: Home / Self Care | Payer: Medicaid Other | Attending: Emergency Medicine | Admitting: Emergency Medicine

## 2016-04-07 DIAGNOSIS — M25571 Pain in right ankle and joints of right foot: Secondary | ICD-10-CM | POA: Diagnosis present

## 2016-04-07 DIAGNOSIS — G8929 Other chronic pain: Secondary | ICD-10-CM | POA: Diagnosis not present

## 2016-04-07 DIAGNOSIS — Z79899 Other long term (current) drug therapy: Secondary | ICD-10-CM | POA: Insufficient documentation

## 2016-04-07 NOTE — Discharge Instructions (Signed)

## 2016-04-07 NOTE — ED Notes (Signed)
Patient not in room upon entering to review d/c instructions. Informed by NT patient left.

## 2016-04-07 NOTE — ED Notes (Signed)
While updating the patients vitals she stated that she was upset and tired of waiting and didn't want to wait for her paperwork because "I know there is nothing on it." Patient left after getting her vitals done.
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
## 2016-04-07 NOTE — ED Notes (Signed)
Pt reports R foot pain for the past several months. Has been seen by podiatrist for same several times. Has had x ray and PT with no relief. Pt wants 2nd opinion. Was told at one point it could be an auto-immune disease or a DVT. [place].

## 2016-08-01 ENCOUNTER — Encounter (HOSPITAL_COMMUNITY): Payer: Self-pay | Admitting: Emergency Medicine

## 2016-08-01 ENCOUNTER — Emergency Department (HOSPITAL_COMMUNITY)
Admission: EM | Admit: 2016-08-01 | Discharge: 2016-08-01 | Disposition: A | Discharge status: Home / Self Care | Payer: Medicaid Other | Attending: Dermatology | Admitting: Dermatology

## 2016-08-01 DIAGNOSIS — Z5321 Procedure and treatment not carried out due to patient leaving prior to being seen by health care provider: Secondary | ICD-10-CM | POA: Insufficient documentation

## 2016-08-01 DIAGNOSIS — N644 Mastodynia: Secondary | ICD-10-CM | POA: Insufficient documentation

## 2016-08-01 HISTORY — DX: Bipolar disorder, unspecified: F31.9

## 2016-08-01 NOTE — ED Triage Notes (Signed)
Pt states that x 2 weeks she has had nipple pain after getting a mammogram because she is on depo. States that she has also had frequent urination and nausea as well. States she is on cipro for the urinary symptoms but they remain.

## 2016-08-03 ENCOUNTER — Inpatient Hospital Stay (HOSPITAL_COMMUNITY)
Admission: AD | Admit: 2016-08-03 | Discharge: 2016-08-03 | Disposition: A | Discharge status: Home / Self Care | Payer: Medicaid Other | Source: Ambulatory Visit | Attending: Obstetrics & Gynecology | Admitting: Obstetrics & Gynecology

## 2016-08-03 ENCOUNTER — Ambulatory Visit (HOSPITAL_COMMUNITY)
Admission: EM | Admit: 2016-08-03 | Discharge: 2016-08-03 | Disposition: A | Discharge status: Home / Self Care | Payer: Medicaid Other | Attending: Family Medicine | Admitting: Family Medicine

## 2016-08-03 ENCOUNTER — Encounter (HOSPITAL_COMMUNITY): Payer: Self-pay | Admitting: Emergency Medicine

## 2016-08-03 DIAGNOSIS — G43A0 Cyclical vomiting, not intractable: Secondary | ICD-10-CM

## 2016-08-03 DIAGNOSIS — R1115 Cyclical vomiting syndrome unrelated to migraine: Secondary | ICD-10-CM

## 2016-08-03 DIAGNOSIS — R110 Nausea: Secondary | ICD-10-CM | POA: Insufficient documentation

## 2016-08-03 LAB — POCT URINALYSIS DIP (DEVICE)
BILIRUBIN URINE: NEGATIVE
Glucose, UA: NEGATIVE mg/dL
KETONES UR: NEGATIVE mg/dL
LEUKOCYTES UA: NEGATIVE
NITRITE: NEGATIVE
PH: 6.5 (ref 5.0–8.0)
Protein, ur: 30 mg/dL — AB
Specific Gravity, Urine: 1.025 (ref 1.005–1.030)
Urobilinogen, UA: 1 mg/dL (ref 0.0–1.0)

## 2016-08-03 LAB — POCT PREGNANCY, URINE: PREG TEST UR: NEGATIVE

## 2016-08-03 NOTE — Discharge Instructions (Signed)
[facility]

## 2016-08-03 NOTE — MAU Note (Signed)
Went to Urgent Care.  They sent her over here because she was having preg symptoms, but she is not preg.  havinng breast tenderness, nausea when she smells food and urinary frequency. No pain associated with urination. Feeling hot often and feeling faint

## 2016-08-03 NOTE — MAU Provider Note (Signed)
History:  20 y.o. Pt sent over from urgent care for eval. She reports that she wen there for sx of nausea and had a UPT which was negative.  But, she reports that she was told to come here anyway. She reports that she would like to be seen by her primary care doc in her home town but, wants a UPT first. Pt reports that she has Depo Provera and she had a neg UPT prior to the shot.  She denies sx currently.   The following portions of the patient's history were reviewed and updated as appropriate: allergies, current medications, past family history, past medical history, past social history, past surgical history and problem list.  Review of Systems:  Pertinent items are noted in HPI.  Objective:  Physical Exam Blood pressure 136/80, pulse 75, temperature 98.6 F (37 C), temperature source Oral, resp. rate 18. Gen: NAD Exam deferred   Labs and Imaging Neg UPT  Assessment & Plan:  Nausea- sx improved at present Neg UPT  Pt will f/u with her primary care provider as needed  Cherylynn Liszewski L. Harraway-Smith, M.D., Evern CoreFACOG

## 2016-08-03 NOTE — ED Triage Notes (Signed)
Patient was recently seen at [facility] for nipple pain States she is still having sore breast, headaches and is constipated States she have been vomiting

## 2016-08-03 NOTE — ED Provider Notes (Signed)
[facility]    CSN: [id number] Arrival date & time: 08/03/16  1558  First Provider Contact:  First MD Initiated Contact with Patient 08/03/16 1702        History   Chief Complaint Chief Complaint  Patient presents with  . Emesis  . Sore Throat    HPI Erica Tran is a 20 y.o. female.   The history is provided by the patient.  Emesis  Severity:  Mild Duration:  2 weeks Number of daily episodes:  3 Quality:  Stomach contents Progression:  Unchanged Chronicity:  New Worsened by:  Food smell Ineffective treatments:  None tried Associated symptoms: no abdominal pain and no fever   Risk factors: not pregnant   Sore Throat  Pertinent negatives include no abdominal pain.    Past Medical History:  Diagnosis Date  . Bipolar affective (HCC)     There are no active problems to display for this patient.   History reviewed. No pertinent surgical history.  OB History    No data available       Home Medications    Prior to Admission medications   Medication Sig Start Date End Date Taking? Authorizing Provider  albuterol (PROVENTIL HFA;VENTOLIN HFA) 108 (90 Base) MCG/ACT inhaler Inhale 2 puffs into the lungs every 4 (four) hours as needed for wheezing or shortness of breath. 01/18/16   Tharon AquasFrank C Patrick, PA  amoxicillin-clavulanate (AUGMENTIN) 875-125 MG tablet Take 1 tablet by mouth 2 (two) times daily. Patient not taking: Reported on 04/07/2016 02/04/16   Ozella Rocksavid J Merrell, MD  fluconazole (DIFLUCAN) 150 MG tablet Take 1 tablet (150 mg total) by mouth daily. Repeat dose in 3 days Patient not taking: Reported on 04/07/2016 02/04/16   Ozella Rocksavid J Merrell, MD  HYDROcodone-acetaminophen [facility](NORCO) 10-325 MG tablet Take 1 tablet by mouth every 6 (six) hours as needed for severe pain.    Historical Provider, MD  levocetirizine (XYZAL) 5 MG tablet Take 5 mg by mouth 2 (two) times daily.    Historical Provider, MD  oxcarbazepine (TRILEPTAL) 600 MG tablet Take 600 mg by mouth  2 (two) times daily.    Historical Provider, MD    Family History Family History  Problem Relation Age of Onset  . Hypertension Other   . Diabetes Other   . Stroke Other   . Heart failure Other     Social History Social History  Substance Use Topics  . Smoking status: Never Smoker  . Smokeless tobacco: Not on file  . Alcohol use Yes     Allergies   Other   Review of Systems Review of Systems  Constitutional: Negative for fever.  Gastrointestinal: Positive for nausea and vomiting. Negative for abdominal pain.  Genitourinary: Positive for menstrual problem. Negative for pelvic pain, vaginal bleeding, vaginal discharge and vaginal pain.  All other systems reviewed and are negative.    Physical Exam Triage Vital Signs ED Triage Vitals  Enc Vitals Group     BP 08/03/16 1623 127/82     Pulse Rate 08/03/16 1623 84     Resp 08/03/16 1623 18     Temp 08/03/16 1623 98.5 F (36.9 C)     Temp Source 08/03/16 1623 Oral     SpO2 08/03/16 1623 100 %     Weight 08/03/16 1623 245 lb (111.1 kg)     Height 08/03/16 1623 5' 4.5" (1.638 m)     Head Circumference --      Peak Flow --  Pain Score 08/03/16 1639 8     Pain Loc --      Pain Edu? --      Excl. in GC? --    No data found.   Updated Vital Signs BP 127/82 (BP Location: Right Arm)   Pulse 84   Temp 98.5 F (36.9 C) (Oral)   Resp 18   Ht 5' 4.5" (1.638 m)   Wt 245 lb (111.1 kg)   SpO2 100%   BMI 41.40 kg/m   Visual Acuity Right Eye Distance:   Left Eye Distance:   Bilateral Distance:    Right Eye Near:   Left Eye Near:    Bilateral Near:     Physical Exam  Constitutional: She is oriented to person, place, and time. She appears well-developed and well-nourished.  HENT:  Mouth/Throat: Oropharynx is clear and moist.  Cardiovascular: Normal rate.   Pulmonary/Chest: She exhibits tenderness.  Abdominal: Soft. Bowel sounds are normal. She exhibits no mass. There is no tenderness. There is no rebound  and no guarding. No hernia.  Neurological: She is alert and oriented to person, place, and time.  Skin: Skin is warm and dry.  Nursing note and vitals reviewed.    UC Treatments / Results  Labs (all labs ordered are listed, but only abnormal results are displayed) Labs Reviewed  POCT URINALYSIS DIP (DEVICE) - Abnormal; Notable for the following:       Result Value   Hgb urine dipstick TRACE (*)    Protein, ur 30 (*)    All other components within normal limits    EKG  EKG Interpretation None       Radiology No results found.  Procedures Procedures (including critical care time)  Medications Ordered in UC Medications - No data to display   Initial Impression / Assessment and Plan / UC Course  I have reviewed the triage vital signs and the nursing notes.  Pertinent labs & imaging results that were available during my care of the patient were reviewed by me and considered in my medical decision making (see chart for details).  Clinical Course      Final Clinical Impressions(s) / UC Diagnoses   Final diagnoses:  None    New Prescriptions New Prescriptions   No medications on file     Linna HoffJames D Isabelly Kobler, MD 08/03/16 1721

## 2016-08-03 NOTE — Discharge Instructions (Signed)
Nausea, Adult  Nausea means you feel sick to your stomach or need to throw up (vomit). It may be a sign of a more serious problem. If nausea gets worse, you may throw up. If you throw up a lot, you may lose too much body fluid (dehydration).  HOME CARE   · Get plenty of rest.  · Ask your doctor how to replace body fluid losses (rehydrate).  · Eat small amounts of food. Sip liquids more often.  · Take all medicines as told by your doctor.  GET HELP RIGHT AWAY IF:  · You have a fever.  · You pass out (faint).  · You keep throwing up or have blood in your throw up.  · You are very weak, have dry lips or a dry mouth, or you are very thirsty (dehydrated).  · You have dark or bloody poop (stool).  · You have very bad chest or belly (abdominal) pain.  · You do not get better after 2 days, or you get worse.  · You have a headache.  MAKE SURE YOU:  · Understand these instructions.  · Will watch your condition.  · Will get help right away if you are not doing well or get worse.     This information is not intended to replace advice given to you by your health care provider. Make sure you discuss any questions you have with your health care provider.     Document Released: 11/17/2011 Document Revised: 02/20/2012 Document Reviewed: 11/17/2011  Elsevier Interactive Patient Education ©2016 Elsevier Inc.

## 2016-08-30 ENCOUNTER — Encounter (HOSPITAL_COMMUNITY): Payer: Self-pay

## 2016-09-12 ENCOUNTER — Encounter (INDEPENDENT_AMBULATORY_CARE_PROVIDER_SITE_OTHER): Payer: Self-pay

## 2016-09-12 ENCOUNTER — Encounter (HOSPITAL_COMMUNITY): Payer: Self-pay | Admitting: Psychiatry

## 2016-09-12 ENCOUNTER — Ambulatory Visit (INDEPENDENT_AMBULATORY_CARE_PROVIDER_SITE_OTHER): Payer: Medicaid Other | Admitting: Psychiatry

## 2016-09-12 DIAGNOSIS — F4310 Post-traumatic stress disorder, unspecified: Secondary | ICD-10-CM | POA: Diagnosis not present
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
## 2016-10-04 ENCOUNTER — Encounter (HOSPITAL_COMMUNITY): Payer: Self-pay | Admitting: Emergency Medicine

## 2016-10-04 ENCOUNTER — Ambulatory Visit (HOSPITAL_COMMUNITY)
Admission: EM | Admit: 2016-10-04 | Discharge: 2016-10-04 | Disposition: A | Discharge status: Home / Self Care | Payer: Medicaid Other | Attending: Family Medicine | Admitting: Family Medicine

## 2016-10-04 ENCOUNTER — Ambulatory Visit (HOSPITAL_COMMUNITY): Payer: Medicaid Other | Admitting: Psychiatry

## 2016-10-04 DIAGNOSIS — K529 Noninfective gastroenteritis and colitis, unspecified: Secondary | ICD-10-CM

## 2016-10-04 DIAGNOSIS — J301 Allergic rhinitis due to pollen: Secondary | ICD-10-CM

## 2016-10-04 MED ORDER — PHENYLEPHRINE-CHLORPHEN-DM 10-4-12.5 MG/5ML PO LIQD: 5.0000 mL | ORAL | 0 refills | Status: DC | PRN

## 2016-10-04 MED ORDER — ONDANSETRON HCL 4 MG PO TABS: 4.0000 mg | ORAL_TABLET | Freq: Four times a day (QID) | ORAL | 0 refills | Status: DC

## 2016-10-04 NOTE — ED Triage Notes (Signed)
Patient reports sinus congestion, runny nose, complains of fluid in ears and cough  Reports yellow diarrhea that started Saturday.  Reports 3 diarrhea stools today.  Patient has a poor appetite and "dry heaving"

## 2016-10-04 NOTE — Discharge Instructions (Signed)
Likely you have 2 separate conditions. You have allergic rhinitis causing your congestion, drainage and cough. Take  the prescribed medicine as directed. This may cause some drowsiness. If you need to reduce the dosage  take one half or three quarters of a teaspoon every 4 hours as needed.  drink plenty of fluids particularly clear liquids such as water, Gatorade or Pedialyte. Zofran for nausea. 4 diarrhea may take an Imodium right ear once and if he continued to have 3 or more stools and 6-8 hours may take 1 more. Do not take enough to stop your diarrhea. For fever, abdominal pain or other worsening seek medical attention promptly. Obtain a local primary care provider.

## 2016-10-04 NOTE — ED Provider Notes (Signed)
CSN: [id number]     Arrival date & time 10/04/16  1236 History   First MD Initiated Contact with Patient 10/04/16 1333     Chief Complaint  Patient presents with  . URI   (Consider location/radiation/quality/duration/timing/severity/associated sxs/prior Treatment) Obese 20 year old female complaining of nausea without vomiting and a decreased appetite. This started just a few days after developing upper respiratory congestion, feeling of fluids in the year, nasal congestion, PND, cough and runny nose. Denies fever or vomiting. She has had diarrhea for the past 2 days, 5-6 times per day. It is liquidy yellow and associated with anal burning.      Past Medical History:  Diagnosis Date  . Bipolar affective [facility](HCC)    Past Surgical History:  Procedure Laterality Date  . FOOT SURGERY     Family History  Problem Relation Age of Onset  . Hypertension Other   . Diabetes Other   . Stroke Other   . Heart failure Other    Social History  Substance Use Topics  . Smoking status: Never Smoker  . Smokeless tobacco: Never Used  . Alcohol use No   OB History    No data available     Review of Systems  Constitutional: Positive for activity change and appetite change. Negative for fever.  HENT: Positive for congestion, postnasal drip, rhinorrhea and sore throat. Negative for trouble swallowing.   Eyes: Negative.   Respiratory: Positive for cough. Negative for chest tightness, shortness of breath and stridor.   Cardiovascular: Negative for chest pain.  Gastrointestinal: Positive for diarrhea and nausea. Negative for abdominal pain and vomiting.  Genitourinary: Negative.   Neurological: Negative.   All other systems reviewed and are negative.   Allergies  Other  Home Medications   Prior to Admission medications   Medication Sig Start Date End Date Taking? Authorizing Provider  levocetirizine (XYZAL) 5 MG tablet Take 5 mg by mouth 2 (two) times daily.   Yes Historical Provider, MD   oxymetazoline (AFRIN) 0.05 % nasal spray Place 1 spray into both nostrils 2 (two) times daily.   Yes Historical Provider, MD  ondansetron (ZOFRAN) 4 MG tablet Take 1 tablet (4 mg total) by mouth every 6 (six) hours. 10/04/16   Hayden Rasmussenavid Shean Gerding, NP  oxcarbazepine (TRILEPTAL) 600 MG tablet Take 600 mg by mouth 2 (two) times daily.    Historical Provider, MD  Phenylephrine-Chlorphen-DM 09-15-11.5 MG/5ML LIQD Take 5 mLs by mouth every 4 (four) hours as needed. For congestion and drainage 10/04/16   Hayden Rasmussenavid Audriana Aldama, NP   Meds Ordered and Administered this Visit  Medications - No data to display  BP 121/62 (BP Location: Left Arm) Comment (BP Location): large cuff  Pulse 63   Temp 98.9 F (37.2 C) (Oral)   Resp 20   SpO2 100%  No data found.   Physical Exam  Constitutional: She is oriented to person, place, and time. She appears well-developed and well-nourished. No distress.  HENT:  Head: Normocephalic and atraumatic.  Right Ear: External ear normal.  Left Ear: External ear normal.  Bilateral TMs are mildly retracted. No erythema or effusion. Oropharynx with moderate amount of clear PND. No exudates or swelling.  Eyes: EOM are normal. Pupils are equal, round, and reactive to light.  Neck: Normal range of motion. Neck supple.  Cardiovascular: Normal rate, regular rhythm and normal heart sounds.   Pulmonary/Chest: Effort normal and breath sounds normal. No respiratory distress. She has no wheezes.  Abdominal: Soft. There is no tenderness. There is  no rebound.  Musculoskeletal: Normal range of motion. She exhibits no edema.  Neurological: She is alert and oriented to person, place, and time.  Skin: Skin is warm and dry.  Psychiatric: She has a normal mood and affect.  Nursing note and vitals reviewed.   Urgent Care Course   Clinical Course    Procedures (including critical care time)  Labs Review Labs Reviewed - No data to display  Imaging Review No results found.   Visual Acuity  Review  Right Eye Distance:   Left Eye Distance:   Bilateral Distance:    Right Eye Near:   Left Eye Near:    Bilateral Near:         MDM   1. Acute seasonal allergic rhinitis due to pollen   2. Gastroenteritis    Likely you have 2 separate conditions. You have allergic rhinitis causing your congestion, drainage and cough. Take  the prescribed medicine as directed. This may cause some drowsiness. If you need to reduce the dosage  take one half or three quarters of a teaspoon every 4 hours as needed.  drink plenty of fluids particularly clear liquids such as water, Gatorade or Pedialyte. Zofran for nausea. 4 diarrhea may take an Imodium right ear once and if he continued to have 3 or more stools and 6-8 hours may take 1 more. Do not take enough to stop your diarrhea. For fever, abdominal pain or other worsening seek medical attention promptly. Obtain a local primary care provider. Meds ordered this encounter  Medications  . oxymetazoline (AFRIN) 0.05 % nasal spray    Sig: Place 1 spray into both nostrils 2 (two) times daily.  Marland Kitchen Phenylephrine-Chlorphen-DM 09-15-11.5 MG/5ML LIQD    Sig: Take 5 mLs by mouth every 4 (four) hours as needed. For congestion and drainage    Dispense:  120 mL    Refill:  0    Order Specific Question:   Supervising Provider    Answer:   Linna Hoff [phone]  . ondansetron (ZOFRAN) 4 MG tablet    Sig: Take 1 tablet (4 mg total) by mouth every 6 (six) hours.    Dispense:  12 tablet    Refill:  0    Order Specific Question:   Supervising Provider    Answer:   Linna Hoff [[id number]]       Hayden Rasmussen, NP 10/04/16 1355

## 2016-10-11 ENCOUNTER — Ambulatory Visit (INDEPENDENT_AMBULATORY_CARE_PROVIDER_SITE_OTHER): Payer: Medicaid Other | Admitting: Psychology

## 2016-10-11 ENCOUNTER — Encounter (HOSPITAL_COMMUNITY): Payer: Self-pay | Admitting: Psychology

## 2016-10-11 DIAGNOSIS — F4310 Post-traumatic stress disorder, unspecified: Secondary | ICD-10-CM

## 2016-10-11 NOTE — Progress Notes (Signed)
   THERAPIST PROGRESS NOTE  Session Time: 9.05am-10.05am  Participation Level: Active  Behavioral Response: Well GroomedAlert, affect constricted- pt guarded  Type of Therapy: Individual Therapy  Treatment Goals addressed: Coping and Diagnosis: pt anger and irritability  Interventions: Motivational Interviewing, Supportive and Other: tx planning  Summary: Erica Tran is a 20 y.o. female who presents with constricted affect and guarded.  Pt reports that she is coming to counseling for anger- needs to work on.  Pt reported she has struggled for a long time w/ anger. Pt reported she has been stressed w/ school- finishing up semester. Pt reports she has been struggling w/ sleep disturbance- not able to sleep 4 nights over the past week.  Pt reports loss of interest and generally no motivation- but denies feeling depressed.  Pt reports she gets angry easily about anything and reports on incident w/ roommates past weekend- dropping off on side of road when made mad. Pt reports use of marijuana that was daily until August 2017 and decided to stop daily use- only 3 times since.  Pt denies use of other drugs.  Pt reports does endorse symptoms of PTSD but reports she doesn't have PTSD and not coming to talk about trauma that occurred when she was 20 y/o.  Pt reported that she sought tx as wanted to be off her medication- which she stopped a month ago- as didn't feel was helping and was making "too quiet".  Pt reports psychiatrist in home town prescribed for Bipolar d/o but denies symptoms or hx of manic episode.  Pt does acknowledge symptoms of depression a month ago- but not current.  Pt reports she doesn't feel that will be helpful to talk about her feelings or have counseling- but also didn't want to not come for tx.  Pt  Is concerned about how dx will effect aspirations for [employer].  Pt wants to think about returning for counseling and if so for what goals.    Suicidal/Homicidal: Nowithout  intent/plan  Therapist Response: Assessed pt current functioning per pt report.  Discussed w/ pt her goals for counseling and had pt explore what is bringing for counseling and barriers to.  Reviewed symptoms having and relevant dx.  Provided education on what counseling is and is not.  Discussed other services in community- anger management classes, counseling through her college.    Plan: Return again in 3 weeks w/ goals for counseling.  Pt wants to consider her options and talk w/ mom to discuss next steps.  Pt agrees for scheduled f/u and to cancel appointment if doesn't want to return for counseling.  Diagnosis: PTSD  Shakedra Beam, LPC 10/11/2016

## 2016-11-08 ENCOUNTER — Ambulatory Visit (HOSPITAL_COMMUNITY): Payer: Self-pay | Admitting: Psychology

## 2016-11-09 ENCOUNTER — Ambulatory Visit (HOSPITAL_COMMUNITY)
Admission: EM | Admit: 2016-11-09 | Discharge: 2016-11-09 | Disposition: A | Discharge status: Home / Self Care | Payer: Medicaid Other | Attending: Internal Medicine | Admitting: Internal Medicine

## 2016-11-09 ENCOUNTER — Encounter (HOSPITAL_COMMUNITY): Payer: Self-pay | Admitting: Emergency Medicine

## 2016-11-09 ENCOUNTER — Other Ambulatory Visit (HOSPITAL_COMMUNITY): Payer: Self-pay | Admitting: Psychology

## 2016-11-09 DIAGNOSIS — Z79899 Other long term (current) drug therapy: Secondary | ICD-10-CM | POA: Diagnosis not present

## 2016-11-09 DIAGNOSIS — J069 Acute upper respiratory infection, unspecified: Secondary | ICD-10-CM | POA: Diagnosis not present

## 2016-11-09 DIAGNOSIS — Z0001 Encounter for general adult medical examination with abnormal findings: Secondary | ICD-10-CM | POA: Diagnosis present

## 2016-11-09 DIAGNOSIS — J029 Acute pharyngitis, unspecified: Secondary | ICD-10-CM | POA: Diagnosis present

## 2016-11-09 DIAGNOSIS — B9789 Other viral agents as the cause of diseases classified elsewhere: Secondary | ICD-10-CM

## 2016-11-09 LAB — POCT RAPID STREP A: Streptococcus, Group A Screen (Direct): NEGATIVE

## 2016-11-09 MED ORDER — HYDROCOD POLST-CPM POLST ER 10-8 MG/5ML PO SUER: 5.0000 mL | Freq: Two times a day (BID) | ORAL | 0 refills | Status: AC | PRN

## 2016-11-09 NOTE — ED Notes (Signed)
Nurse Selena BattenKim lapan-Hutchens obtained the strep specimen

## 2016-11-09 NOTE — ED Triage Notes (Signed)
Pt reports sore throat and a burning sensation. PT also reports body aches and cold chills. Tonsils do have white patches present

## 2016-11-09 NOTE — ED Provider Notes (Signed)
CSN: [id number]     Arrival date & time 11/09/16  1239 History   First MD Initiated Contact with Patient 11/09/16 1318     Chief Complaint  Patient presents with  . Sore Throat   (Consider location/radiation/quality/duration/timing/severity/associated sxs/prior Treatment) Patient presents today for 4-day history of sore throat accompany by congestion, running nose, coughing and headache. Patient is concern for strep as she saw "white stuff" on her tonsils. Patient denies fever at home.        Past Medical History:  Diagnosis Date  . Bipolar affective [facility](HCC)    Past Surgical History:  Procedure Laterality Date  . FOOT SURGERY     Family History  Problem Relation Age of Onset  . Hypertension Other   . Diabetes Other   . Stroke Other   . Heart failure Other   . Drug abuse Mother   . Bipolar disorder Mother   . Drug abuse Father   . Bipolar disorder Father    Social History  Substance Use Topics  . Smoking status: Never Smoker  . Smokeless tobacco: Never Used  . Alcohol use Yes     Comment: rarely   OB History    No data available     Review of Systems  Constitutional: Positive for fever. Negative for chills and fatigue.  HENT: Positive for congestion, ear pain and sore throat. Negative for rhinorrhea, sinus pain, sinus pressure and sneezing.   Eyes: Negative for visual disturbance.  Respiratory: Positive for cough. Negative for shortness of breath.   Gastrointestinal: Negative for abdominal pain, diarrhea, nausea and vomiting.  Musculoskeletal: Positive for myalgias.  Skin: Negative for rash.  Neurological: Positive for headaches. Negative for dizziness.    Allergies  Other and Tramadol  Home Medications   Prior to Admission medications   Medication Sig Start Date End Date Taking? Authorizing Provider  chlorpheniramine-HYDROcodone (TUSSIONEX PENNKINETIC ER) 10-8 MG/5ML SUER Take 5 mLs by mouth every 12 (twelve) hours as needed for cough. 11/09/16 11/16/16   Lucia EstelleFeng Lilburn Straw, NP  levocetirizine (XYZAL) 5 MG tablet Take 5 mg by mouth 2 (two) times daily.    Historical Provider, MD  ondansetron (ZOFRAN) 4 MG tablet Take 1 tablet (4 mg total) by mouth every 6 (six) hours. 10/04/16   Hayden Rasmussenavid Mabe, NP  oxcarbazepine (TRILEPTAL) 600 MG tablet Take 600 mg by mouth 2 (two) times daily.    Historical Provider, MD  oxymetazoline (AFRIN) 0.05 % nasal spray Place 1 spray into both nostrils 2 (two) times daily.    Historical Provider, MD  Phenylephrine-Chlorphen-DM 09-15-11.5 MG/5ML LIQD Take 5 mLs by mouth every 4 (four) hours as needed. For congestion and drainage 10/04/16   Hayden Rasmussenavid Mabe, NP   Meds Ordered and Administered this Visit  Medications - No data to display  BP 139/83   Pulse 79   Temp 99.1 F (37.3 C) (Oral)   Resp 16   Ht 5\' 4"  (1.626 m)   Wt 240 lb (108.9 kg)   SpO2 100%   BMI 41.20 kg/m  No data found.   Physical Exam  Constitutional: She is oriented to person, place, and time. She appears well-developed and well-nourished.  HENT:  Head: Normocephalic and atraumatic.  Right Ear: External ear normal.  Left Ear: External ear normal.  Nose: Nose normal.  Mouth/Throat: Oropharynx is clear and moist. No oropharyngeal exudate.  Tonsil 2 + with very small amt of exudate. Right TM slightly erythematous. Patient reports no ear pain currently  Eyes: Conjunctivae and EOM  are normal. Pupils are equal, round, and reactive to light. Right eye exhibits no discharge. Left eye exhibits no discharge.  Neck: Normal range of motion. Neck supple.  Tender to palpate over bilateral anterior cervical area  Cardiovascular: Normal rate, regular rhythm and normal heart sounds.   Pulmonary/Chest: Effort normal. No respiratory distress. She has no wheezes.  Abdominal: Soft. Bowel sounds are normal. She exhibits no distension. There is no tenderness.  Lymphadenopathy:    She has no cervical adenopathy.  Neurological: She is alert and oriented to person, place, and  time.  Skin: Skin is warm and dry. No rash noted.  Psychiatric: She has a normal mood and affect.  Nursing note and vitals reviewed.   Urgent Care Course   Clinical Course     Procedures (including critical care time)  Labs Review Labs Reviewed  POCT RAPID STREP A    Imaging Review No results found.  MDM   1. Viral upper respiratory tract infection    The patient's signs and symptoms are most consistent with a diagnosis of URI. Patient educated that antibiotic would not be helpful since this is a viral illness. Treatment is supportive. Patient encouraged to rest, drink plenty of fluid, use Motrin/Tylenol as needed at appropriate dose for pain/fever. Encouraged to do salt water gargle, throat lozenges and honey for sore throat. Rx for Tussionex given to help with cough. Informed to f/u with PCP if symptoms does not improve next week or worsen.    Lucia EstelleFeng Hildagard Sobecki, NP 11/09/16 1340

## 2017-01-03 ENCOUNTER — Emergency Department (HOSPITAL_COMMUNITY)
Admission: EM | Admit: 2017-01-03 | Discharge: 2017-01-03 | Disposition: A | Discharge status: Home / Self Care | Payer: Medicaid Other | Attending: Emergency Medicine | Admitting: Emergency Medicine

## 2017-01-03 ENCOUNTER — Ambulatory Visit (HOSPITAL_COMMUNITY)
Admission: EM | Admit: 2017-01-03 | Discharge: 2017-01-03 | Disposition: A | Discharge status: Home / Self Care | Payer: Medicaid Other

## 2017-01-03 ENCOUNTER — Encounter (HOSPITAL_COMMUNITY): Payer: Self-pay | Admitting: Emergency Medicine

## 2017-01-03 DIAGNOSIS — Z5321 Procedure and treatment not carried out due to patient leaving prior to being seen by health care provider: Secondary | ICD-10-CM | POA: Diagnosis not present

## 2017-01-03 DIAGNOSIS — R51 Headache: Secondary | ICD-10-CM | POA: Insufficient documentation

## 2017-01-03 LAB — CBC
HEMATOCRIT: 39.7 % (ref 36.0–46.0)
Hemoglobin: 12.8 g/dL (ref 12.0–15.0)
MCH: 25.1 pg — AB (ref 26.0–34.0)
MCHC: 32.2 g/dL (ref 30.0–36.0)
MCV: 77.8 fL — AB (ref 78.0–100.0)
Platelets: 400 10*3/uL (ref 150–400)
RBC: 5.1 MIL/uL (ref 3.87–5.11)
RDW: 14.7 % (ref 11.5–15.5)
WBC: 10.4 10*3/uL (ref 4.0–10.5)

## 2017-01-03 LAB — BASIC METABOLIC PANEL
Anion gap: 8 (ref 5–15)
BUN: 9 mg/dL (ref 6–20)
CHLORIDE: 109 mmol/L (ref 101–111)
CO2: 22 mmol/L (ref 22–32)
Calcium: 9.2 mg/dL (ref 8.9–10.3)
Creatinine, Ser: 1.08 mg/dL — ABNORMAL HIGH (ref 0.44–1.00)
GFR calc Af Amer: >60 mL/min (ref >60)
GFR calc non Af Amer: >60 mL/min (ref >60)
GLUCOSE: 78 mg/dL (ref 65–99)
POTASSIUM: 3.8 mmol/L (ref 3.5–5.1)
Sodium: 139 mmol/L (ref 135–145)

## 2017-01-03 LAB — I-STAT BETA HCG BLOOD, ED (MC, WL, AP ONLY): I-stat hCG, quantitative: <5 m[IU]/mL (ref <5)

## 2017-01-03 NOTE — ED Notes (Signed)
Pt came to nurse first to advise she is leaving, does not want to wait any longer.  Encouraged pt to stay, pt refused.  Advised to return if symptoms persist or worsens.

## 2017-01-03 NOTE — ED Triage Notes (Signed)
Pt states she has been having migraines and passing out- this has been going on for two weeks. Pt states she has not taken any medication for the pain. Neuro intact, alert and oriented. Denies vomiting. Pt adds that she has had some blood in her poop and yellow bile in it. Pt isn't sure how long that has been going on. Pt states she poops once a week.

## 2017-01-05 ENCOUNTER — Encounter (HOSPITAL_COMMUNITY): Payer: Self-pay

## 2017-01-05 ENCOUNTER — Emergency Department (HOSPITAL_COMMUNITY)
Admission: EM | Admit: 2017-01-05 | Discharge: 2017-01-05 | Disposition: A | Discharge status: Home / Self Care | Payer: Medicaid Other | Attending: Emergency Medicine | Admitting: Emergency Medicine

## 2017-01-05 DIAGNOSIS — R51 Headache: Secondary | ICD-10-CM | POA: Diagnosis not present

## 2017-01-05 DIAGNOSIS — L918 Other hypertrophic disorders of the skin: Secondary | ICD-10-CM | POA: Insufficient documentation

## 2017-01-05 DIAGNOSIS — Z79899 Other long term (current) drug therapy: Secondary | ICD-10-CM | POA: Insufficient documentation

## 2017-01-05 DIAGNOSIS — N9489 Other specified conditions associated with female genital organs and menstrual cycle: Secondary | ICD-10-CM | POA: Diagnosis not present

## 2017-01-05 DIAGNOSIS — R519 Headache, unspecified: Secondary | ICD-10-CM

## 2017-01-05 LAB — CBC WITH DIFFERENTIAL/PLATELET
Basophils Absolute: 0 10*3/uL (ref 0.0–0.1)
Basophils Relative: 0 %
Eosinophils Absolute: 0.5 10*3/uL (ref 0.0–0.7)
Eosinophils Relative: 5 %
HCT: 41.2 % (ref 36.0–46.0)
HEMOGLOBIN: 13.3 g/dL (ref 12.0–15.0)
LYMPHS ABS: 2.9 10*3/uL (ref 0.7–4.0)
LYMPHS PCT: 31 %
MCH: 25.1 pg — AB (ref 26.0–34.0)
MCHC: 32.3 g/dL (ref 30.0–36.0)
MCV: 77.7 fL — AB (ref 78.0–100.0)
Monocytes Absolute: 0.5 10*3/uL (ref 0.1–1.0)
Monocytes Relative: 5 %
NEUTROS PCT: 59 %
Neutro Abs: 5.7 10*3/uL (ref 1.7–7.7)
Platelets: 383 10*3/uL (ref 150–400)
RBC: 5.3 MIL/uL — AB (ref 3.87–5.11)
RDW: 15.1 % (ref 11.5–15.5)
WBC: 9.6 10*3/uL (ref 4.0–10.5)

## 2017-01-05 LAB — HCG, QUANTITATIVE, PREGNANCY: hCG, Beta Chain, Quant, S: 1 m[IU]/mL (ref <5)

## 2017-01-05 LAB — BASIC METABOLIC PANEL
Anion gap: 7 (ref 5–15)
BUN: 7 mg/dL (ref 6–20)
CHLORIDE: 108 mmol/L (ref 101–111)
CO2: 25 mmol/L (ref 22–32)
Calcium: 9.5 mg/dL (ref 8.9–10.3)
Creatinine, Ser: 0.87 mg/dL (ref 0.44–1.00)
GFR calc Af Amer: >60 mL/min (ref >60)
GFR calc non Af Amer: >60 mL/min (ref >60)
GLUCOSE: 90 mg/dL (ref 65–99)
POTASSIUM: 4.2 mmol/L (ref 3.5–5.1)
Sodium: 140 mmol/L (ref 135–145)

## 2017-01-05 LAB — I-STAT BETA HCG BLOOD, ED (MC, WL, AP ONLY)

## 2017-01-05 MED ORDER — KETOROLAC TROMETHAMINE 30 MG/ML IJ SOLN
30.0000 mg | Freq: Once | INTRAMUSCULAR | Status: AC
  Administered 2017-01-05: 30 mg via INTRAMUSCULAR
  Filled 2017-01-05: qty 1

## 2017-01-05 NOTE — ED Triage Notes (Signed)
Per Pt, Pt is coming from home with complaints of a migraine that has been going on for two weeks. Reports episodes of "passing out." Pt presented for bleeding mole under her left breast. Reports, "Since yall are not busy, I will go ahead and get my head checked out too."

## 2017-01-05 NOTE — ED Notes (Signed)
Pt is concerned about what appears to be a skin tag under her right breast. Pt states it has been painful and bleeding when moved. Pt also states she has had a headache for two weeks and has only taken one 200mg  ibuprofen for the pain. Pt denies photophobia, neuro changes, blurred vision or nausea.

## 2017-01-05 NOTE — ED Notes (Signed)
Unable to locate patient to go over discharge paperwork

## 2017-01-05 NOTE — Discharge Instructions (Signed)
Recommend taking 600 mg ibuprofen every 6 hours as needed for your headache. You may also take Tylenol as prescribed over-the-counter and between her doses of ibuprofen as seen for additional relief. Continue drinking fluids at home to remain hydrated. I recommend following up with your primary care provider within the next week for follow-up evaluation regarding your headaches/migraines. Call the dermatology office to sit below for further management of your skin tag/removal. Please return to the Emergency Department if symptoms worsen or new onset of fever, neck stiffness, visual changes, photophobia, abdominal pain, vomiting, urinary symptoms, numbness, tingling, weakness, seizures, syncope.

## 2017-01-05 NOTE — ED Provider Notes (Signed)
[facility]-EMERGENCY DEPT Provider Note   CSN: [id number] Arrival date & time: 01/05/17  0930     History   Chief Complaint Chief Complaint  Patient presents with  . Headache    HPI Erica Tran is a 21 y.o. female.  HPI   Patient is a 21 year old female with no pertinent past medical history presents the ED with multiple complaints. Patient reports having a small mole under her left breast that has been present for years. She reports after having adhesive stickers placed when she was seen in the ED a few days ago she has since had small area of redness to the mole and states it had a small amount of bleeding as well. Denies any associated swelling or other drainage. Denies fever.  Patient also reports having a constant throbbing headache to the back of her head that radiates around the sides for the past 2 weeks. Denies any aggravating or alleviating factors. Patient reports she has had similar headaches in the past but notes they usually do not last for 2 weeks. She reports taking one dose of 200 mg ibuprofen at home without relief. Endorses associated intermittent nausea. Patient also reports 4 days ago having a syncopal episode. She states when she got up off the couch to walk into the kitchen to make food she began to feel lightheaded which resulted in her passing out. Patient reports her syncopal episode was witnessed by her roommate who denied any seizure-like activity or postictal behavior. Patient denies any additional syncopal episodes. She reports continuing to have mild intermittent lightheadedness that typically occurs when going from sitting to standing. Denies fever, neck stiffness, visual changes, photophobia, rash, nasal congestion, rhinorrhea, eye drainage, sore throat, cough, shortness of breath, chest pain, palpitations, abdominal pain, vomiting, diarrhea, urinary symptoms, vaginal bleeding, vaginal discharge, numbness, tingling, weakness.   Past Medical History:    Diagnosis Date  . Bipolar affective (HCC)     There are no active problems to display for this patient.   Past Surgical History:  Procedure Laterality Date  . FOOT SURGERY      OB History    No data available       Home Medications    Prior to Admission medications   Medication Sig Start Date End Date Taking? Authorizing Provider  levocetirizine (XYZAL) 5 MG tablet Take 5 mg by mouth 2 (two) times daily.    Historical Provider, MD  ondansetron (ZOFRAN) 4 MG tablet Take 1 tablet (4 mg total) by mouth every 6 (six) hours. 10/04/16   Hayden Rasmussenavid Mabe, NP  oxcarbazepine (TRILEPTAL) 600 MG tablet Take 600 mg by mouth 2 (two) times daily.    Historical Provider, MD  oxymetazoline (AFRIN) 0.05 % nasal spray Place 1 spray into both nostrils 2 (two) times daily.    Historical Provider, MD  Phenylephrine-Chlorphen-DM 09-15-11.5 MG/5ML LIQD Take 5 mLs by mouth every 4 (four) hours as needed. For congestion and drainage 10/04/16   Hayden Rasmussenavid Mabe, NP    Family History Family History  Problem Relation Age of Onset  . Hypertension Other   . Diabetes Other   . Stroke Other   . Heart failure Other   . Drug abuse Mother   . Bipolar disorder Mother   . Drug abuse Father   . Bipolar disorder Father     Social History Social History  Substance Use Topics  . Smoking status: Never Smoker  . Smokeless tobacco: Never Used  . Alcohol use Yes     Comment: rarely  Allergies   Other and Tramadol   Review of Systems Review of Systems  Gastrointestinal: Positive for nausea.  Skin:       Skin tag  Neurological: Positive for light-headedness and headaches.  All other systems reviewed and are negative.    Physical Exam Updated Vital Signs BP 108/70   Pulse (!) 57   Temp 99.3 F (37.4 C) (Oral)   Resp 17   Ht 5\' 4"  (1.626 m)   Wt 108.9 kg   SpO2 100%   BMI 41.20 kg/m   Physical Exam  Constitutional: She is oriented to person, place, and time. She appears well-developed and  well-nourished.  HENT:  Head: Normocephalic and atraumatic. Head is without raccoon's eyes, without Battle's sign, without abrasion and without laceration.  Right Ear: Tympanic membrane normal. No hemotympanum.  Left Ear: Tympanic membrane normal. No hemotympanum.  Nose: Nose normal. No sinus tenderness, nasal deformity, septal deviation or nasal septal hematoma. No epistaxis.  Mouth/Throat: Uvula is midline, oropharynx is clear and moist and mucous membranes are normal. No oropharyngeal exudate, posterior oropharyngeal edema, posterior oropharyngeal erythema or tonsillar abscesses.  Eyes: Conjunctivae and EOM are normal. Pupils are equal, round, and reactive to light. Right eye exhibits no discharge. Left eye exhibits no discharge. No scleral icterus.  Neck: Normal range of motion. Neck supple.  Cardiovascular: Normal rate, regular rhythm, normal heart sounds and intact distal pulses.   Pulmonary/Chest: Effort normal and breath sounds normal. No respiratory distress. She has no wheezes. She has no rales. She exhibits no tenderness.  Abdominal: Soft. Bowel sounds are normal. She exhibits no distension and no mass. There is no tenderness. There is no rebound and no guarding. No hernia.  Musculoskeletal: Normal range of motion. She exhibits no edema or tenderness.  No cervical, thoracic, or lumbar spine midline TTP.  Full ROM of bilateral upper and lower extremities with 5/5 strength.   2+ radial and PT pulses. Sensation grossly intact.  Patient able to stand and ambulate without assistance, no ataxia noted.  Neurological: She is alert and oriented to person, place, and time. She has normal strength. No cranial nerve deficit or sensory deficit. Coordination and gait normal.  Skin: Skin is warm and dry.  Small skin tag noted to left inferior breast with small area of erythema noted to lateral aspect of skin tag. No drainage or surrounding erythema, swelling, induration or fluctuance. Nontender to  palpation.   Nursing note and vitals reviewed.    ED Treatments / Results  Labs (all labs ordered are listed, but only abnormal results are displayed) Labs Reviewed  CBC WITH DIFFERENTIAL/PLATELET - Abnormal; Notable for the following:       Result Value   RBC 5.30 (*)    MCV 77.7 (*)    MCH 25.1 (*)    All other components within normal limits  BASIC METABOLIC PANEL  HCG, QUANTITATIVE, PREGNANCY  I-STAT BETA HCG BLOOD, ED ([facility], [facility], [facility] ONLY)    EKG  EKG Interpretation  Date/Time:  Thursday January 05 2017 11:02:51 EST Ventricular Rate:  60 PR Interval:    QRS Duration: 90 QT Interval:  434 QTC Calculation: 434 R Axis:   34 Text Interpretation:  Sinus rhythm unremarkable ecg Confirmed by Gerhard Munch  MD ([id number]) on 01/05/2017 11:10:28 AM       Radiology No results found.  Procedures Procedures (including critical care time)  Medications Ordered in ED Medications  ketorolac (TORADOL) 30 MG/ML injection 30 mg (30 mg Intramuscular Given 01/05/17  1057)     Initial Impression / Assessment and Plan / ED Course  I have reviewed the triage vital signs and the nursing notes.  Pertinent labs & imaging results that were available during my care of the patient were reviewed by me and considered in my medical decision making (see chart for details).     Patient presents with headache consistent with her usual migraines. Reports only taking one dose of 200 mg ibuprofen at home with minimal relief. Endorses associated lightheadedness upon standing, nausea and reports one syncopal episode that occurred 4 days ago after she stood up from the couch. Denies fever. VSS. Exam unremarkable. No neuro deficits. EKG shows sinus rhythm with no acute ischemic changes or arrhythmias noted. Pregnancy negative. Labs unremarkable. Negative orthostatics. Patient given Toradol in the ED. On reevaluation patient reports significant improvement of her headache. I do not suspect SAH, ICH,  intracranial lesion, meningitis, encephalopathy or encephalitis and do not think that cranial imaging is needed at this time.  Patient's headache appears to be consistent with her typical migraines. Suspect patient's single reported syncopal episode was likely due to episode of orthostatic hypotension. Plan to discharge patient home with symptomatic treatment including continued oral hydration and use of NSAIDs/Tylenol for pain relief. Advised patient to follow up with PCP for further management of her migraines. Discussed return precautions.  Patient also presents for evaluation of a skin tag under her left breast. She reports having small amount of bleeding and redness to the skin tag after having an adhesive sticker placed a few days ago. Exam revealed small skin tag to left inferior breast small area of erythema noted to lateral edge, no drainage or surrounding swelling, erythema or warmth to suggest infection. Patient given information to follow-up with dermatology as needed as she reported that she wanted the skin tag removed.  Final Clinical Impressions(s) / ED Diagnoses   Final diagnoses:  Acute nonintractable headache, unspecified headache type  Skin tag    New Prescriptions New Prescriptions   No medications on file     Barrett Henle, Cordelia Poche 01/05/17 1218    Gerhard Munch, MD 01/05/17 1714

## 2017-01-12 ENCOUNTER — Encounter (HOSPITAL_COMMUNITY): Payer: Self-pay | Admitting: Psychology

## 2017-01-12 NOTE — Progress Notes (Signed)
Erica Tran is a 21 y.o. female patient who is discharged from counseling as pt did call and cancel f/u for 11/08/16 informing not wanting counseling at this time.  Outpatient Therapist Discharge Summary  Erica Tran    07/04/1996   Admission Date: 10/11/16   Discharge Date:  01/12/17 Reason for Discharge:  Not seeking tx Diagnosis:  PTSD  Comments:  May return if seeking tx  Alfredo BattyLeanne M Linday Rhodes          [place], [facility]

## 2017-01-24 ENCOUNTER — Ambulatory Visit (HOSPITAL_COMMUNITY)
Admission: EM | Admit: 2017-01-24 | Discharge: 2017-01-24 | Disposition: A | Discharge status: Home / Self Care | Payer: Medicaid Other | Attending: Family Medicine | Admitting: Family Medicine

## 2017-01-24 ENCOUNTER — Encounter (HOSPITAL_COMMUNITY): Payer: Self-pay | Admitting: Emergency Medicine

## 2017-01-24 DIAGNOSIS — A084 Viral intestinal infection, unspecified: Secondary | ICD-10-CM | POA: Diagnosis not present

## 2017-01-24 DIAGNOSIS — R110 Nausea: Secondary | ICD-10-CM

## 2017-01-24 MED ORDER — ONDANSETRON 8 MG PO TBDP: 8.0000 mg | ORAL_TABLET | Freq: Three times a day (TID) | ORAL | 0 refills | Status: DC | PRN

## 2017-01-24 MED ORDER — ONDANSETRON 4 MG PO TBDP
4.0000 mg | ORAL_TABLET | Freq: Once | ORAL | Status: AC
  Administered 2017-01-24: 4 mg via ORAL

## 2017-01-24 MED ORDER — ONDANSETRON 4 MG PO TBDP
ORAL_TABLET | ORAL | Status: AC
  Filled 2017-01-24: qty 1

## 2017-01-24 NOTE — ED Provider Notes (Signed)
CSN: [id number]     Arrival date & time 01/24/17  1401 History   First MD Initiated Contact with Patient 01/24/17 1539     Chief Complaint  Patient presents with  . Bloated   (Consider location/radiation/quality/duration/timing/severity/associated sxs/prior Treatment) Patient c/o abdominal bloating and NVD starting this am.   The history is provided by the patient.  Emesis  Severity:  Moderate Quality:  Stomach contents Able to tolerate:  Liquids Progression:  Worsening Chronicity:  New Relieved by:  Nothing Worsened by:  Nothing Ineffective treatments:  None tried Associated symptoms: diarrhea     Past Medical History:  Diagnosis Date  . Bipolar affective [facility](HCC)    Past Surgical History:  Procedure Laterality Date  . FOOT SURGERY     Family History  Problem Relation Age of Onset  . Hypertension Other   . Diabetes Other   . Stroke Other   . Heart failure Other   . Drug abuse Mother   . Bipolar disorder Mother   . Drug abuse Father   . Bipolar disorder Father    Social History  Substance Use Topics  . Smoking status: Never Smoker  . Smokeless tobacco: Never Used  . Alcohol use Yes     Comment: rarely   OB History    No data available     Review of Systems  Constitutional: Negative.   HENT: Negative.   Eyes: Negative.   Respiratory: Negative.   Cardiovascular: Negative.   Gastrointestinal: Positive for diarrhea, nausea and vomiting.  Endocrine: Negative.   Genitourinary: Negative.   Musculoskeletal: Negative.   Allergic/Immunologic: Negative.   Neurological: Negative.   Hematological: Negative.   Psychiatric/Behavioral: Negative.     Allergies  Other and Tramadol  Home Medications   Prior to Admission medications   Medication Sig Start Date End Date Taking? Authorizing Provider  levocetirizine (XYZAL) 5 MG tablet Take 5 mg by mouth 2 (two) times daily.   Yes Historical Provider, MD  ondansetron (ZOFRAN ODT) 8 MG disintegrating tablet Take 1  tablet (8 mg total) by mouth every 8 (eight) hours as needed for nausea or vomiting. 01/24/17   Deatra CanterWilliam J Oxford, FNP   Meds Ordered and Administered this Visit   Medications  ondansetron (ZOFRAN-ODT) disintegrating tablet 4 mg (4 mg Oral Given 01/24/17 1545)    BP 104/70 (BP Location: Left Arm)   Pulse 119   Temp 98.9 F (37.2 C) (Oral)   Resp 20   SpO2 96%  No data found.   Physical Exam  Constitutional: She appears well-developed and well-nourished.  HENT:  Head: Normocephalic and atraumatic.  Right Ear: External ear normal.  Left Ear: External ear normal.  Mouth/Throat: Oropharynx is clear and moist.  Eyes: Conjunctivae and EOM are normal. Pupils are equal, round, and reactive to light.  Neck: Normal range of motion. Neck supple.  Cardiovascular: Normal rate, regular rhythm and normal heart sounds.   Pulmonary/Chest: Effort normal and breath sounds normal.  Abdominal: Soft. Bowel sounds are normal.  Nursing note and vitals reviewed.   Urgent Care Course     Procedures (including critical care time)  Labs Review Labs Reviewed - No data to display  Imaging Review No results found.   Visual Acuity Review  Right Eye Distance:   Left Eye Distance:   Bilateral Distance:    Right Eye Near:   Left Eye Near:    Bilateral Near:         MDM   1. Viral gastroenteritis  2. Nausea    Zofran ODT 4mg  no Zofran ODT 8mg  one po tid prn #21  Push po fluids, rest, tylenol and motrin otc prn as directed for fever, arthralgias, and myalgias.  Follow up prn if sx's continue or persist.    Deatra Canter, FNP 01/24/17 [phone]

## 2017-01-24 NOTE — ED Triage Notes (Signed)
[facility] a complaint of abdominal bloating with N/V/D that started this am.

## 2017-02-09 ENCOUNTER — Encounter: Payer: Self-pay | Admitting: Neurology

## 2017-02-09 ENCOUNTER — Ambulatory Visit (INDEPENDENT_AMBULATORY_CARE_PROVIDER_SITE_OTHER): Payer: Medicaid Other | Admitting: Neurology

## 2017-02-09 VITALS — BP 111/74 | HR 77 | Resp 16 | Ht 64.0 in | Wt 252.0 lb

## 2017-02-09 DIAGNOSIS — R519 Headache, unspecified: Secondary | ICD-10-CM | POA: Insufficient documentation

## 2017-02-09 DIAGNOSIS — R51 Headache: Secondary | ICD-10-CM | POA: Diagnosis not present

## 2017-02-09 DIAGNOSIS — M5481 Occipital neuralgia: Secondary | ICD-10-CM

## 2017-02-09 DIAGNOSIS — R55 Syncope and collapse: Secondary | ICD-10-CM | POA: Diagnosis not present

## 2017-02-09 DIAGNOSIS — G4700 Insomnia, unspecified: Secondary | ICD-10-CM

## 2017-02-09 DIAGNOSIS — M542 Cervicalgia: Secondary | ICD-10-CM | POA: Diagnosis not present

## 2017-02-09 MED ORDER — GABAPENTIN 300 MG PO CAPS: ORAL_CAPSULE | ORAL | 11 refills | Status: DC
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
## 2017-02-10 ENCOUNTER — Telehealth: Payer: Self-pay | Admitting: Neurology

## 2017-02-10 MED ORDER — ETODOLAC ER 400 MG PO TB24: 400.0000 mg | ORAL_TABLET | Freq: Two times a day (BID) | ORAL | 3 refills | Status: DC | PRN

## 2017-02-10 NOTE — Telephone Encounter (Signed)
Pt sys the injection yesterday did not help the HA, it increased in intensity and neck hurts also. Please call

## 2017-02-10 NOTE — Telephone Encounter (Signed)
I have spoken with pt. this am.  She c/o postprocedural pain from onb, sts. h/a no better.  I have reiterated instructions given by RAS yesterday, and per RAS, offered Etodolac 400mg  po bid prn h/a.  She is agreeable.  Rx. escribed to [facility] per request/fim

## 2017-03-16 ENCOUNTER — Encounter (HOSPITAL_COMMUNITY): Payer: Self-pay | Admitting: *Deleted

## 2017-03-16 ENCOUNTER — Ambulatory Visit (HOSPITAL_COMMUNITY)
Admission: EM | Admit: 2017-03-16 | Discharge: 2017-03-16 | Disposition: A | Discharge status: Home / Self Care | Payer: Medicaid Other | Attending: Family Medicine | Admitting: Family Medicine

## 2017-03-16 DIAGNOSIS — L02214 Cutaneous abscess of groin: Secondary | ICD-10-CM

## 2017-03-16 NOTE — Discharge Instructions (Signed)
Recommend using over-the-counter ibuprofen and/or Tylenol as needed and directed for pain.  Do not feel antibiotics are indicated at this point.  If you began to have any increased pain or swelling associated with fever/chills he should go immediately to the emergency room for evaluation.  Recommend getting established with an OB/GYN physician and you should contact the number given tomorrow morning.

## 2017-03-16 NOTE — ED Provider Notes (Signed)
CSN: [id number]     Arrival date & time 03/16/17  1903 History   First MD Initiated Contact with Patient 03/16/17 1937     Chief Complaint  Patient presents with  . Abscess   (Consider location/radiation/quality/duration/timing/severity/associated sxs/prior Treatment) HPI 21 year old female comes in today with a 4-5 month history of painful groin abscess,  states that she has tried to express material by squeezing the area but has been unsuccessful. No drainage. She denies any fever chills. No vaginal discharge, itching. States that the area is painful to touch. She is not established with an OB/GYN physician. Past Medical History:  Diagnosis Date  . Bipolar affective (HCC)   . Headache    Past Surgical History:  Procedure Laterality Date  . FOOT SURGERY     Family History  Problem Relation Age of Onset  . Hypertension Other   . Diabetes Other   . Stroke Other   . Heart failure Other   . Drug abuse Mother   . Bipolar disorder Mother   . Drug abuse Father   . Bipolar disorder Father    Social History  Substance Use Topics  . Smoking status: Never Smoker  . Smokeless tobacco: Never Used  . Alcohol use Yes     Comment: rarely   OB History    No data available     Review of Systems  Constitutional: Negative.   HENT: Negative.   Respiratory: Negative.   Cardiovascular: Negative.   Gastrointestinal: Negative.   Genitourinary: Negative for dysuria, hematuria, vaginal bleeding, vaginal discharge and vaginal pain.  Musculoskeletal: Negative.   Neurological: Negative.   Psychiatric/Behavioral: Negative.     Allergies  Other and Tramadol  Home Medications   Prior to Admission medications   Medication Sig Start Date End Date Taking? Authorizing Provider  etodolac (LODINE XL) 400 MG 24 hr tablet Take 1 tablet (400 mg total) by mouth 2 (two) times daily as needed. 02/10/17   Asa Lente, MD  gabapentin (NEURONTIN) 300 MG capsule One po qAM, one po qPM and twopo qHS  02/09/17   Asa Lente, MD  levocetirizine (XYZAL) 5 MG tablet Take 5 mg by mouth 2 (two) times daily.    Historical Provider, MD  medroxyPROGESTERone (DEPO-PROVERA) 150 MG/ML injection Inject into the muscle.    Historical Provider, MD  mometasone (NASONEX) 50 MCG/ACT nasal spray Place into the nose.    Historical Provider, MD   Meds Ordered and Administered this Visit  Medications - No data to display  BP 111/76 (BP Location: Left Arm)   Pulse 85   Temp 98 F (36.7 C) (Oral)   Resp 16   SpO2 100%  No data found.   Physical Exam  Constitutional: She appears well-developed and well-nourished. No distress.  HENT:  Head: Normocephalic and atraumatic.  Eyes: EOM are normal. Pupils are equal, round, and reactive to light.  Neck: Normal range of motion.  Pulmonary/Chest: No respiratory distress.  Abdominal: She exhibits no distension.  Genitourinary:     Genitourinary Comments: There is a 2-3 cm round palpable lesion that is moderate to markedly tender. No gross signs of infection.  No redness. No rash.  Musculoskeletal: Normal range of motion.    Urgent Care Course     Procedures (including critical care time)  Labs Review Labs Reviewed - No data to display  Imaging Review No results found.   Visual Acuity Review  Right Eye Distance:   Left Eye Distance:   Bilateral Distance:  Right Eye Near:   Left Eye Near:    Bilateral Near:         MDM   1. Groin abscess    Patient states that she gets yeast infections with antibiotics. I do not think antibiotic is indicated at this point. I do recommend that she get established within OB/GYN physician as soon as possible for evaluation and treatment of the area addressed today. She can use over-the-counter ibuprofen and/or Tylenol when necessary as directed. Do not recommend that she continue to attempt to express any material from this area. She voices understanding. All questions answered.    Naida Sleight,  PA-C 03/16/17 2035

## 2017-03-16 NOTE — ED Triage Notes (Signed)
Boil  r    Vaginal  Area  X  5  Months  Ingrown     Hair  Tender  To  Touch  Hard  And  painfull  To  Touch

## 2017-03-25 ENCOUNTER — Ambulatory Visit (HOSPITAL_COMMUNITY)
Admission: EM | Admit: 2017-03-25 | Discharge: 2017-03-25 | Disposition: A | Discharge status: Home / Self Care | Payer: Medicaid Other | Attending: Family Medicine | Admitting: Family Medicine

## 2017-03-25 ENCOUNTER — Ambulatory Visit (INDEPENDENT_AMBULATORY_CARE_PROVIDER_SITE_OTHER): Payer: Medicaid Other

## 2017-03-25 ENCOUNTER — Encounter (HOSPITAL_COMMUNITY): Payer: Self-pay | Admitting: Emergency Medicine

## 2017-03-25 DIAGNOSIS — S93401A Sprain of unspecified ligament of right ankle, initial encounter: Secondary | ICD-10-CM | POA: Diagnosis not present

## 2017-03-25 DIAGNOSIS — S93402A Sprain of unspecified ligament of left ankle, initial encounter: Secondary | ICD-10-CM

## 2017-03-25 DIAGNOSIS — W19XXXA Unspecified fall, initial encounter: Secondary | ICD-10-CM | POA: Diagnosis not present

## 2017-03-25 DIAGNOSIS — IMO0001 Reserved for inherently not codable concepts without codable children: Secondary | ICD-10-CM
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
## 2017-03-25 NOTE — Discharge Instructions (Signed)
Both of the ankles are sprained but there is no acute fracture. No need to stay on crutches for the next few days and an ASO splint has been provided. Left ankle. I think it would be a good idea to reapply the Cam Walker boot for your right ankle into the see her orthopedic doctor next week.  Ibuprofen or Lodine for the pain along with continued Neurontin.
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
## 2017-05-05 ENCOUNTER — Ambulatory Visit (INDEPENDENT_AMBULATORY_CARE_PROVIDER_SITE_OTHER): Payer: Medicaid Other | Admitting: Neurology

## 2017-05-05 ENCOUNTER — Encounter: Payer: Self-pay | Admitting: Neurology

## 2017-05-05 VITALS — BP 115/80 | HR 91 | Resp 20 | Ht 64.0 in | Wt 251.5 lb

## 2017-05-05 DIAGNOSIS — M542 Cervicalgia: Secondary | ICD-10-CM

## 2017-05-05 DIAGNOSIS — R55 Syncope and collapse: Secondary | ICD-10-CM | POA: Diagnosis not present

## 2017-05-05 DIAGNOSIS — M5481 Occipital neuralgia: Secondary | ICD-10-CM | POA: Diagnosis not present
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
## 2017-05-25 IMAGING — CR DG CHEST 2V
2 series · 2 of 2 positions shown · non-contrast
Comparison: None.

CLINICAL DATA: Central chest pain and shortness of breath.
Coughing.

EXAM:
CHEST  2 VIEW

[w chest pa]
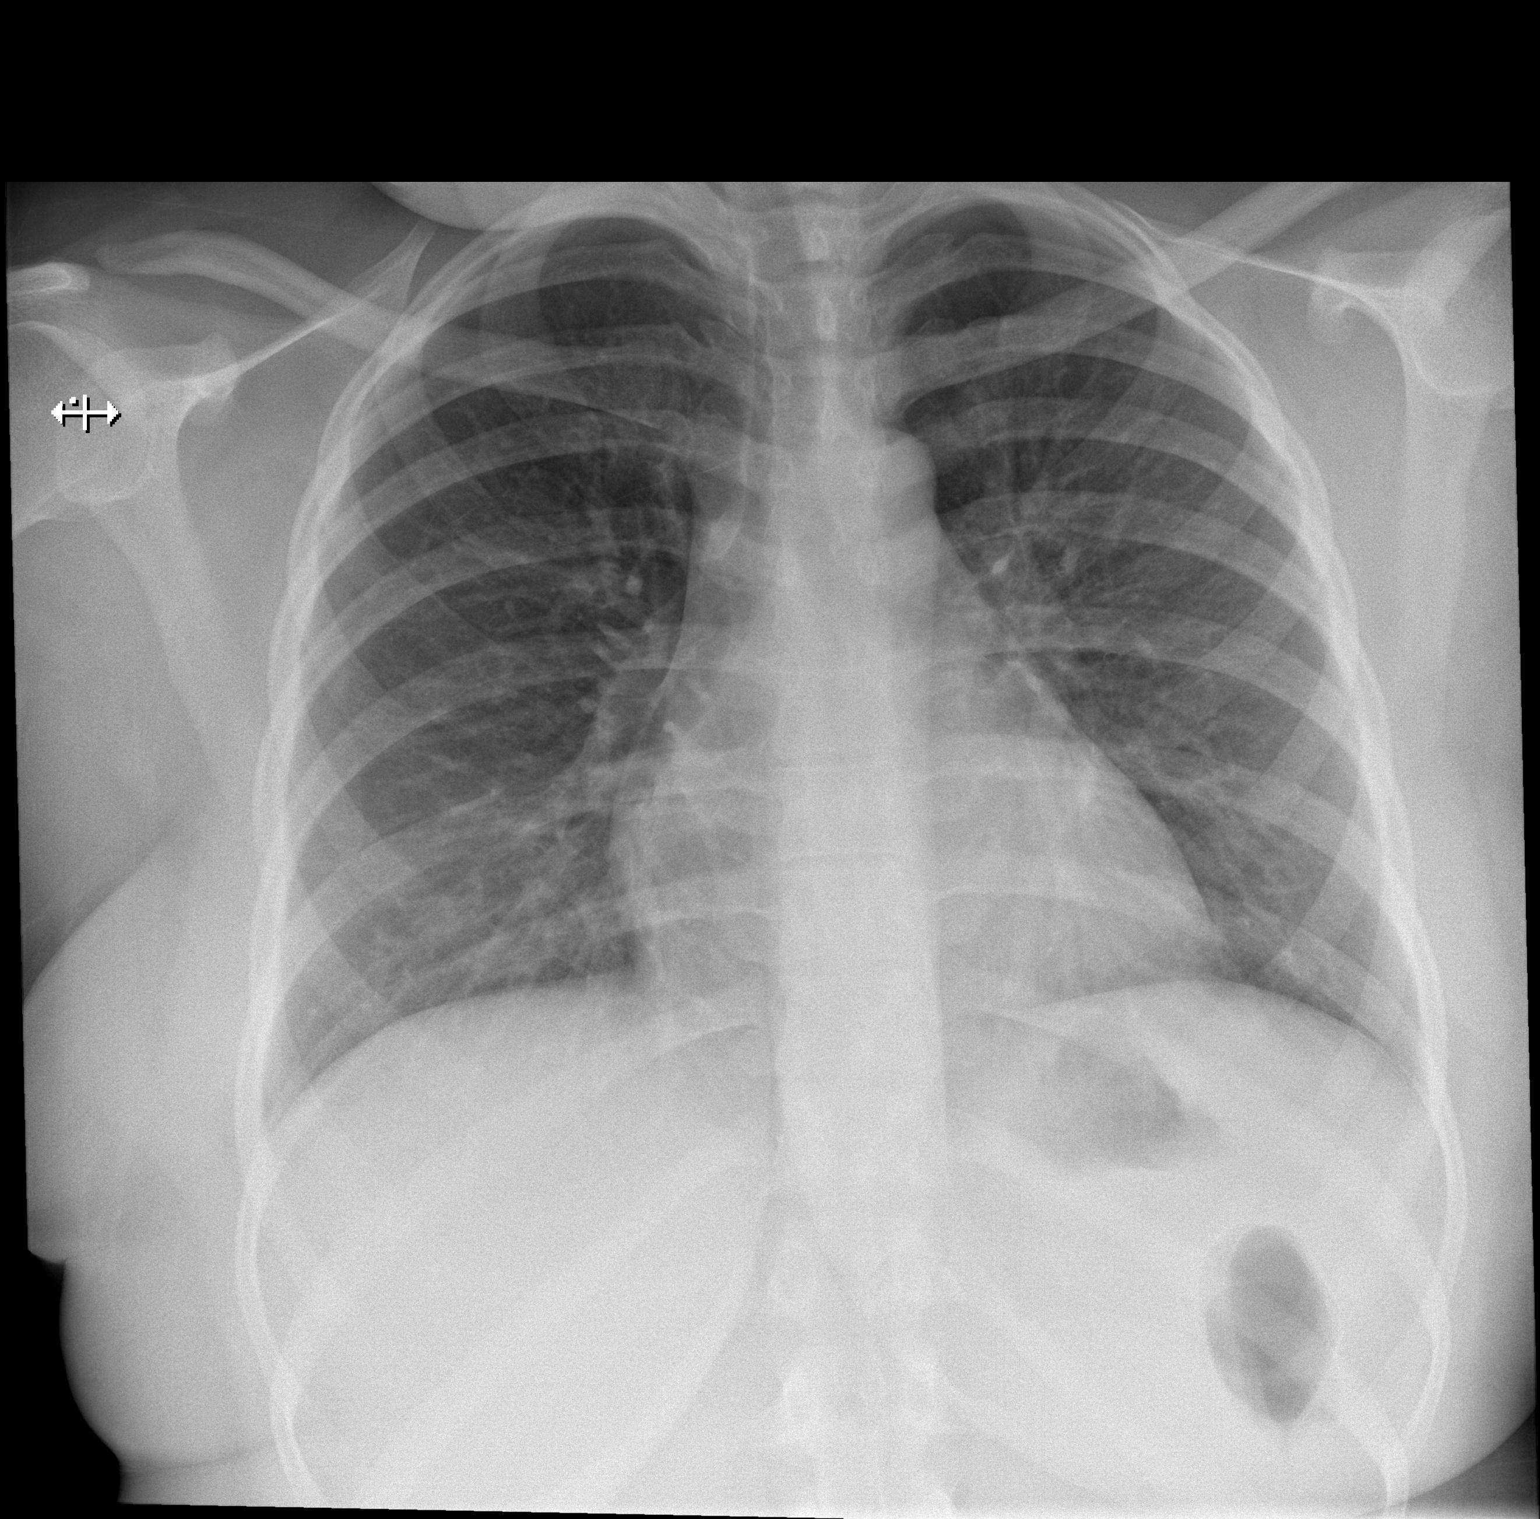

[w chest lat]
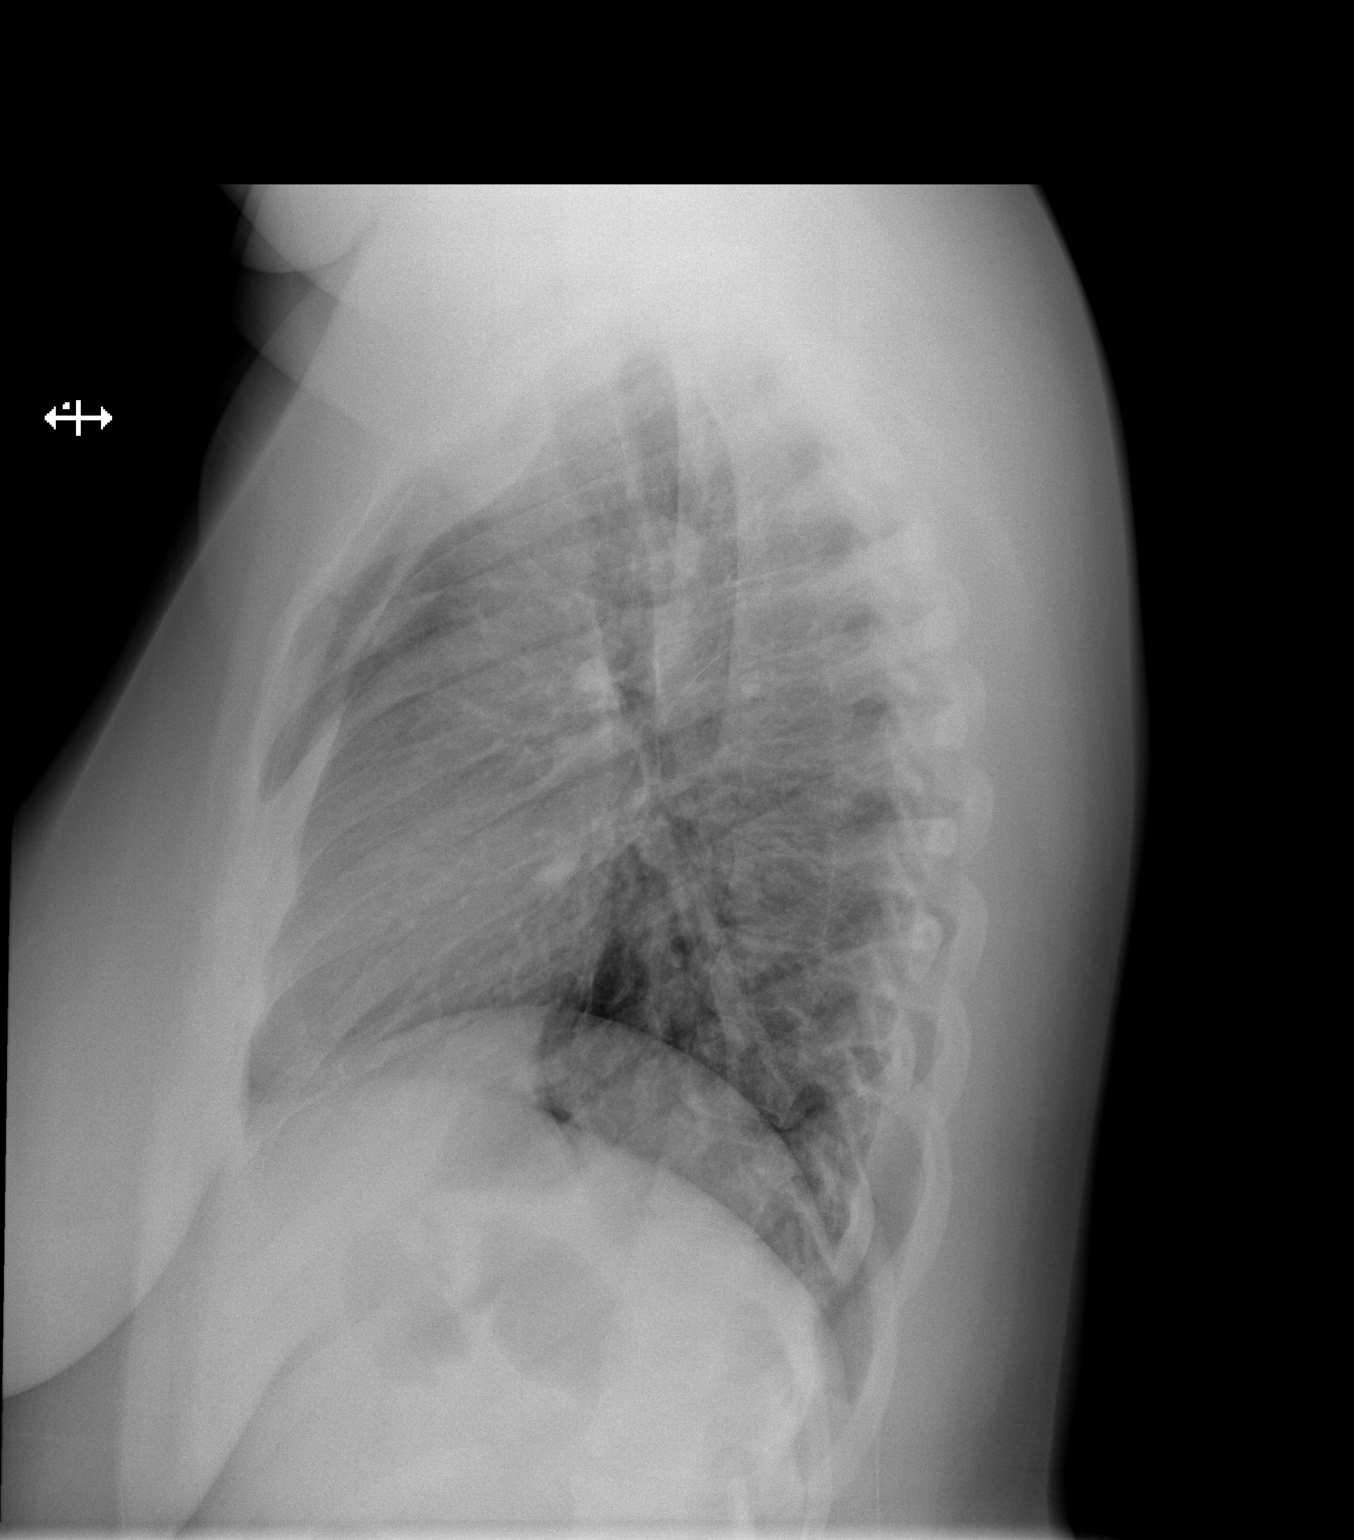

[2 of 2 positions shown; findings below may reference images not displayed]

FINDINGS: Heart size is normal. Mediastinal shadows are normal. There is
central bronchial thickening but no infiltrate, collapse or
effusion. No bony abnormality.
IMPRESSION: Possible bronchitis.  No consolidation or collapse.

## 2018-02-06 ENCOUNTER — Ambulatory Visit (INDEPENDENT_AMBULATORY_CARE_PROVIDER_SITE_OTHER): Payer: 59 | Admitting: Cardiovascular Disease

## 2018-02-06 ENCOUNTER — Telehealth: Payer: Self-pay | Admitting: Cardiovascular Disease

## 2018-02-06 ENCOUNTER — Encounter: Payer: Self-pay | Admitting: Cardiovascular Disease

## 2018-02-06 VITALS — BP 146/72 | HR 96 | Ht 64.5 in | Wt 276.0 lb

## 2018-02-06 DIAGNOSIS — R0789 Other chest pain: Secondary | ICD-10-CM

## 2018-02-06 DIAGNOSIS — I517 Cardiomegaly: Secondary | ICD-10-CM | POA: Diagnosis not present

## 2018-02-06 MED ORDER — PANTOPRAZOLE SODIUM 40 MG PO TBEC: 40.0000 mg | DELAYED_RELEASE_TABLET | Freq: Every day | ORAL | 11 refills | Status: DC

## 2018-02-06 NOTE — Addendum Note (Signed)
Addended by: Chana BodeGREEN, Leelan Rajewski L on: 02/06/2018 04:13 PM   Modules accepted: Orders

## 2018-02-06 NOTE — Assessment & Plan Note (Signed)
Erica Tran was seen and tried urgent care back in November for atypical chest pain. She's had chest pain daily off and on since that time which is sharp and non-anginal. She does have a history of reflux as well. EKG shows nonspecific changes. She has no other risk factors for heart disease other than family history. I'm going to empirically begin her on a proton pump inhibitor. I do not think she needs any further cardiology workup for this.

## 2018-02-06 NOTE — Telephone Encounter (Signed)
New message  Erica Tran verbalized that she is calling for the RN  Because she can not send pt information because pt has not signed to release them  To [facility]  Please call to get information to proceed

## 2018-02-06 NOTE — Telephone Encounter (Signed)
New Message     *STAT* If patient is at the pharmacy, call can be transferred to refill team.   1. Which medications need to be refilled? (please list name of each medication and dose if known)  protonix   2. Which pharmacy/location (including street and city if local pharmacy) is medication to be sent to?  [facility] [address]  3. Do they need a 30 day or 90 day supply? 90

## 2018-02-06 NOTE — Patient Instructions (Signed)
Medication Instructions: Your physician recommends that you continue on your current medications as directed. Please refer to the Current Medication list given to you today.  START Protonix 40 mg daily  Testing/Procedures: Your physician has requested that you have an echocardiogram. Echocardiography is a painless test that uses sound waves to create images of your heart. It provides your doctor with information about the size and shape of your heart and how well your heart's chambers and valves are working. This procedure takes approximately one hour. There are no restrictions for this procedure.   Follow-Up: Your physician recommends that you schedule a follow-up appointment in: 3 months with Dr. Allyson SabalBerry.

## 2018-02-06 NOTE — Progress Notes (Signed)
02/06/2018 Aeralyn Taylor-Hines   1996/04/13  [id number]  Primary Physician System, Provider Not In Primary Cardiologist: Runell Gess MD Milagros Loll, [place], [place]  HPI:  Mardie Kellen is a 22 y.o. severely overweight single African-American female with no children who was referred by [facility] for atypical chest pain and cardiac enlargement. 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 She helps R school. She has no cardiac risk factors other than family history. Her mother apparently had an LVAD and sounds like he is on a milrinone infusion. She has never had a heart attack or stroke. She does have history of bipolar disorder. She went to try to urgent care in November for atypical chest pain. Apparently H chest x-ray showed cardiomegaly. She was told to see a cardiologist. She does get atypical chest pain on a daily basis which is sharp and nonanginal.    Current Meds  Medication Sig  . levocetirizine (XYZAL) 5 MG tablet Take 5 mg by mouth every evening.  . medroxyPROGESTERone (DEPO-PROVERA) 150 MG/ML injection Inject into the muscle.     Allergies  Allergen Reactions  . Other     Contrast dye for MRI  . Tramadol Hives    Social History   Socioeconomic History  . Marital status: Single    Spouse name: Not on file  . Number of children: Not on file  . Years of education: Not on file  . Highest education level: Not on file  Social Needs  . Financial resource strain: Not on file  . Food insecurity - worry: Not on file  . Food insecurity - inability: Not on file  . Transportation needs - medical: Not on file  . Transportation needs - non-medical: Not on file  Occupational History  . Not on file  Tobacco Use  . Smoking status: Never Smoker  . Smokeless tobacco: Never Used  Substance and Sexual Activity  . Alcohol use: No    Comment: rarely  . Drug use: No  . Sexual activity: Not  on file  Other Topics Concern  . Not on file  Social History Narrative  . Not on file     Review of Systems: General: negative for chills, fever, night sweats or weight changes.  Cardiovascular: negative for chest pain, dyspnea on exertion, edema, orthopnea, palpitations, paroxysmal nocturnal dyspnea or shortness of breath Dermatological: negative for rash Respiratory: negative for cough or wheezing Urologic: negative for hematuria Abdominal: negative for nausea, vomiting, diarrhea, bright red blood per rectum, melena, or hematemesis Neurologic: negative for visual changes, syncope, or dizziness All other systems reviewed and are otherwise negative except as noted above.    Blood pressure (!) 146/72, pulse 96, height 5' 4.5" (1.638 m), weight 276 lb (125.2 kg).  General appearance: alert and no distress Neck: no adenopathy, no carotid bruit, no JVD, supple, symmetrical, trachea midline and thyroid not enlarged, symmetric, no tenderness/mass/nodules Lungs: clear to auscultation bilaterally Heart: regular rate and rhythm, S1, S2 normal, no murmur, click, rub or gallop Extremities: extremities normal, atraumatic, no cyanosis or edema Pulses: 2+ and symmetric Skin: Skin color, texture, turgor normal. No rashes or lesions Neurologic: Alert and oriented X 3, normal strength and tone. Normal symmetric reflexes. Normal coordination and gait  EKG sinus rhythm at 96 with nonspecific ST and T-wave changes. I personally reviewed this EKG  ASSESSMENT AND PLAN:   Cardiac enlargement Wynonia was seen  at [facility] back in November for atypical chest pain. Chest x-ray was performed and she was told she had "cardiac enlargement. I am going to get a 2-D echo to further evaluate.  Atypical chest pain Kathrynn RunningShia was seen and tried urgent care back in November for atypical chest pain. She's had chest pain daily off and on since that time which is sharp and non-anginal. She does have a history of reflux  as well. EKG shows nonspecific changes. She has no other risk factors for heart disease other than family history. I'm going to empirically begin her on a proton pump inhibitor. I do not think she needs any further cardiology workup for this.      Runell GessJonathan J. Ginevra Tacker MD FACP,FACC,FAHA, [facility] 02/06/2018 12:12 PM

## 2018-02-06 NOTE — Assessment & Plan Note (Signed)
Erica Tran was seen at [facility] back in November for atypical chest pain. Chest x-ray was performed and she was told she had "cardiac enlargement. I am going to get a 2-D echo to further evaluate.

## 2018-02-13 ENCOUNTER — Other Ambulatory Visit: Payer: Self-pay | Admitting: Cardiovascular Disease

## 2018-02-13 MED ORDER — PANTOPRAZOLE SODIUM 40 MG PO TBEC: 40.0000 mg | DELAYED_RELEASE_TABLET | Freq: Every day | ORAL | 11 refills | Status: DC

## 2018-02-13 NOTE — Telephone Encounter (Signed)
Rx sent to requested pharmacy

## 2018-02-15 ENCOUNTER — Other Ambulatory Visit (HOSPITAL_COMMUNITY): Payer: Self-pay

## 2018-05-08 ENCOUNTER — Ambulatory Visit: Payer: 59 | Admitting: Cardiovascular Disease

## 2019-07-01 ENCOUNTER — Other Ambulatory Visit: Payer: Self-pay

## 2019-07-01 ENCOUNTER — Ambulatory Visit (INDEPENDENT_AMBULATORY_CARE_PROVIDER_SITE_OTHER): Payer: 59 | Admitting: Neurology

## 2019-07-01 ENCOUNTER — Encounter

## 2019-07-01 ENCOUNTER — Encounter: Payer: Self-pay | Admitting: Neurology

## 2019-07-01 VITALS — BP 148/88 | HR 99 | Temp 98.2°F | Ht 64.5 in | Wt 284.5 lb

## 2019-07-01 DIAGNOSIS — M5481 Occipital neuralgia: Secondary | ICD-10-CM

## 2019-07-01 DIAGNOSIS — M542 Cervicalgia: Secondary | ICD-10-CM

## 2019-07-01 DIAGNOSIS — R519 Headache, unspecified: Secondary | ICD-10-CM

## 2019-07-01 DIAGNOSIS — R51 Headache: Secondary | ICD-10-CM | POA: Diagnosis not present
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
## 2019-07-02 ENCOUNTER — Ambulatory Visit: Payer: Self-pay | Admitting: Neurology

## 2022-07-21 ENCOUNTER — Ambulatory Visit: Payer: Medicaid Other

## 2022-07-26 ENCOUNTER — Ambulatory Visit: Payer: Self-pay | Admitting: Nurse Practitioner

## 2022-07-26 DIAGNOSIS — F339 Major depressive disorder, recurrent, unspecified: Secondary | ICD-10-CM | POA: Insufficient documentation

## 2022-07-26 DIAGNOSIS — F4310 Post-traumatic stress disorder, unspecified: Secondary | ICD-10-CM | POA: Insufficient documentation

## 2022-07-26 DIAGNOSIS — Z113 Encounter for screening for infections with a predominantly sexual mode of transmission: Secondary | ICD-10-CM

## 2022-07-26 DIAGNOSIS — F319 Bipolar disorder, unspecified: Secondary | ICD-10-CM | POA: Insufficient documentation

## 2022-07-26 LAB — WET PREP FOR TRICH, YEAST, CLUE
Trichomonas Exam: NEGATIVE
Yeast Exam: NEGATIVE

## 2022-07-26 LAB — HM HIV SCREENING LAB: HM HIV Screening: NEGATIVE

## 2022-07-26 NOTE — Progress Notes (Signed)
Pt here for STD screening.  Wet mount results reviewed, no treatment required per Provider.  Condoms given.  Berdie Ogren, RN
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
## 2022-12-01 ENCOUNTER — Other Ambulatory Visit: Payer: Self-pay

## 2022-12-01 DIAGNOSIS — I517 Cardiomegaly: Secondary | ICD-10-CM

## 2022-12-02 ENCOUNTER — Other Ambulatory Visit: Payer: Self-pay | Admitting: Family Medicine

## 2022-12-02 DIAGNOSIS — I517 Cardiomegaly: Secondary | ICD-10-CM

## 2022-12-14 ENCOUNTER — Other Ambulatory Visit: Payer: No Typology Code available for payment source

## 2024-06-12 ENCOUNTER — Ambulatory Visit
